# Patient Record
Sex: Female | Born: 1965 | Race: White | Hispanic: No | Marital: Married | Smoking: Current every day smoker
Health system: Southern US, Community
[De-identification: ages and names within clinical notes are randomized; demographics above are authoritative.]

## PROBLEM LIST (undated history)

## (undated) DIAGNOSIS — I10 Essential (primary) hypertension: Secondary | ICD-10-CM

## (undated) DIAGNOSIS — R3915 Urgency of urination: Secondary | ICD-10-CM

## (undated) DIAGNOSIS — N201 Calculus of ureter: Secondary | ICD-10-CM

## (undated) DIAGNOSIS — Z973 Presence of spectacles and contact lenses: Secondary | ICD-10-CM

## (undated) DIAGNOSIS — E039 Hypothyroidism, unspecified: Secondary | ICD-10-CM

## (undated) DIAGNOSIS — Z87442 Personal history of urinary calculi: Secondary | ICD-10-CM

## (undated) DIAGNOSIS — Z8489 Family history of other specified conditions: Secondary | ICD-10-CM

## (undated) DIAGNOSIS — R350 Frequency of micturition: Secondary | ICD-10-CM

## (undated) DIAGNOSIS — E119 Type 2 diabetes mellitus without complications: Secondary | ICD-10-CM

## (undated) DIAGNOSIS — M329 Systemic lupus erythematosus, unspecified: Secondary | ICD-10-CM

## (undated) DIAGNOSIS — IMO0002 Reserved for concepts with insufficient information to code with codable children: Secondary | ICD-10-CM

## (undated) HISTORY — PX: CHOLECYSTECTOMY OPEN: SUR202

---

## 2003-06-25 ENCOUNTER — Ambulatory Visit (HOSPITAL_COMMUNITY): Admission: RE | Admit: 2003-06-25 | Discharge: 2003-06-25 | Payer: Self-pay | Admitting: Pulmonary Disease

## 2004-10-22 ENCOUNTER — Ambulatory Visit (HOSPITAL_COMMUNITY): Admission: RE | Admit: 2004-10-22 | Discharge: 2004-10-22 | Payer: Self-pay | Admitting: Pulmonary Disease

## 2005-06-07 ENCOUNTER — Ambulatory Visit (HOSPITAL_COMMUNITY): Admission: RE | Admit: 2005-06-07 | Discharge: 2005-06-07 | Payer: Self-pay | Admitting: Pulmonary Disease

## 2010-10-26 ENCOUNTER — Other Ambulatory Visit (HOSPITAL_COMMUNITY): Payer: Self-pay | Admitting: Pulmonary Disease

## 2010-10-26 ENCOUNTER — Ambulatory Visit (HOSPITAL_COMMUNITY)
Admission: RE | Admit: 2010-10-26 | Discharge: 2010-10-26 | Disposition: A | Discharge status: Home / Self Care | Payer: BC Managed Care – PPO | Source: Ambulatory Visit | Attending: Pulmonary Disease | Admitting: Pulmonary Disease

## 2010-10-26 ENCOUNTER — Encounter (HOSPITAL_COMMUNITY): Payer: Self-pay

## 2010-10-26 DIAGNOSIS — Z139 Encounter for screening, unspecified: Secondary | ICD-10-CM

## 2010-10-26 DIAGNOSIS — M25562 Pain in left knee: Secondary | ICD-10-CM

## 2010-10-26 DIAGNOSIS — M25569 Pain in unspecified knee: Secondary | ICD-10-CM | POA: Insufficient documentation

## 2010-10-26 HISTORY — DX: Essential (primary) hypertension: I10

## 2010-10-30 ENCOUNTER — Ambulatory Visit (HOSPITAL_COMMUNITY)
Admission: RE | Admit: 2010-10-30 | Discharge: 2010-10-30 | Disposition: A | Discharge status: Home / Self Care | Payer: BC Managed Care – PPO | Source: Ambulatory Visit | Attending: Pulmonary Disease | Admitting: Pulmonary Disease

## 2010-10-30 DIAGNOSIS — Z1231 Encounter for screening mammogram for malignant neoplasm of breast: Secondary | ICD-10-CM | POA: Insufficient documentation

## 2010-10-30 DIAGNOSIS — Z139 Encounter for screening, unspecified: Secondary | ICD-10-CM

## 2010-12-26 ENCOUNTER — Emergency Department (HOSPITAL_COMMUNITY)
Admission: EM | Admit: 2010-12-26 | Discharge: 2010-12-26 | Disposition: A | Discharge status: Home / Self Care | Payer: BC Managed Care – PPO | Attending: Emergency Medicine | Admitting: Emergency Medicine

## 2010-12-26 DIAGNOSIS — Z79899 Other long term (current) drug therapy: Secondary | ICD-10-CM | POA: Insufficient documentation

## 2010-12-26 DIAGNOSIS — I10 Essential (primary) hypertension: Secondary | ICD-10-CM | POA: Insufficient documentation

## 2010-12-26 DIAGNOSIS — R5381 Other malaise: Secondary | ICD-10-CM | POA: Insufficient documentation

## 2010-12-26 DIAGNOSIS — E119 Type 2 diabetes mellitus without complications: Secondary | ICD-10-CM | POA: Insufficient documentation

## 2010-12-26 DIAGNOSIS — N764 Abscess of vulva: Secondary | ICD-10-CM | POA: Insufficient documentation

## 2012-08-24 ENCOUNTER — Other Ambulatory Visit (HOSPITAL_COMMUNITY): Payer: Self-pay | Admitting: Pulmonary Disease

## 2012-08-24 DIAGNOSIS — Z139 Encounter for screening, unspecified: Secondary | ICD-10-CM

## 2012-08-25 ENCOUNTER — Ambulatory Visit (HOSPITAL_COMMUNITY)
Admission: RE | Admit: 2012-08-25 | Discharge: 2012-08-25 | Disposition: A | Discharge status: Home / Self Care | Payer: BC Managed Care – PPO | Source: Ambulatory Visit | Attending: Pulmonary Disease | Admitting: Pulmonary Disease

## 2012-08-25 DIAGNOSIS — Z139 Encounter for screening, unspecified: Secondary | ICD-10-CM

## 2012-08-25 DIAGNOSIS — Z1231 Encounter for screening mammogram for malignant neoplasm of breast: Secondary | ICD-10-CM | POA: Insufficient documentation

## 2013-12-31 ENCOUNTER — Other Ambulatory Visit (HOSPITAL_COMMUNITY): Payer: Self-pay | Admitting: Pulmonary Disease

## 2013-12-31 DIAGNOSIS — Z1231 Encounter for screening mammogram for malignant neoplasm of breast: Secondary | ICD-10-CM

## 2014-01-01 ENCOUNTER — Ambulatory Visit (HOSPITAL_COMMUNITY)
Admission: RE | Admit: 2014-01-01 | Discharge: 2014-01-01 | Disposition: A | Discharge status: Home / Self Care | Payer: BC Managed Care – PPO | Source: Ambulatory Visit | Attending: Pulmonary Disease | Admitting: Pulmonary Disease

## 2014-01-01 DIAGNOSIS — Z1231 Encounter for screening mammogram for malignant neoplasm of breast: Secondary | ICD-10-CM | POA: Insufficient documentation

## 2014-11-22 ENCOUNTER — Other Ambulatory Visit (HOSPITAL_COMMUNITY): Payer: Self-pay | Admitting: Pulmonary Disease

## 2014-11-22 DIAGNOSIS — Z1231 Encounter for screening mammogram for malignant neoplasm of breast: Secondary | ICD-10-CM

## 2015-01-13 ENCOUNTER — Ambulatory Visit (HOSPITAL_COMMUNITY): Payer: BC Managed Care – PPO

## 2015-01-27 ENCOUNTER — Ambulatory Visit (HOSPITAL_COMMUNITY)
Admission: RE | Admit: 2015-01-27 | Discharge: 2015-01-27 | Disposition: A | Discharge status: Home / Self Care | Payer: BC Managed Care – PPO | Source: Ambulatory Visit | Attending: Pulmonary Disease | Admitting: Pulmonary Disease

## 2015-01-27 DIAGNOSIS — Z1231 Encounter for screening mammogram for malignant neoplasm of breast: Secondary | ICD-10-CM

## 2015-01-29 ENCOUNTER — Ambulatory Visit (HOSPITAL_COMMUNITY): Payer: BC Managed Care – PPO

## 2015-03-17 ENCOUNTER — Other Ambulatory Visit (HOSPITAL_COMMUNITY): Payer: Self-pay | Admitting: Pulmonary Disease

## 2015-03-17 ENCOUNTER — Ambulatory Visit (HOSPITAL_COMMUNITY)
Admission: RE | Admit: 2015-03-17 | Discharge: 2015-03-17 | Disposition: A | Discharge status: Home / Self Care | Payer: BC Managed Care – PPO | Source: Ambulatory Visit | Attending: Pulmonary Disease | Admitting: Pulmonary Disease

## 2015-03-17 DIAGNOSIS — R3919 Other difficulties with micturition: Secondary | ICD-10-CM | POA: Diagnosis not present

## 2015-03-17 DIAGNOSIS — R1030 Lower abdominal pain, unspecified: Secondary | ICD-10-CM | POA: Insufficient documentation

## 2015-03-17 DIAGNOSIS — N200 Calculus of kidney: Secondary | ICD-10-CM

## 2015-03-17 DIAGNOSIS — M545 Low back pain: Secondary | ICD-10-CM | POA: Diagnosis not present

## 2015-03-17 DIAGNOSIS — K7460 Unspecified cirrhosis of liver: Secondary | ICD-10-CM | POA: Insufficient documentation

## 2015-03-17 DIAGNOSIS — R161 Splenomegaly, not elsewhere classified: Secondary | ICD-10-CM | POA: Diagnosis not present

## 2015-03-17 DIAGNOSIS — K760 Fatty (change of) liver, not elsewhere classified: Secondary | ICD-10-CM | POA: Insufficient documentation

## 2015-03-17 DIAGNOSIS — R319 Hematuria, unspecified: Secondary | ICD-10-CM | POA: Insufficient documentation

## 2015-03-17 DIAGNOSIS — N202 Calculus of kidney with calculus of ureter: Secondary | ICD-10-CM | POA: Diagnosis not present

## 2015-03-17 DIAGNOSIS — N132 Hydronephrosis with renal and ureteral calculous obstruction: Secondary | ICD-10-CM | POA: Insufficient documentation

## 2015-03-21 ENCOUNTER — Other Ambulatory Visit: Payer: Self-pay | Admitting: Urology

## 2015-03-24 ENCOUNTER — Encounter: Payer: Self-pay | Admitting: Gastroenterology

## 2015-04-02 ENCOUNTER — Encounter (HOSPITAL_BASED_OUTPATIENT_CLINIC_OR_DEPARTMENT_OTHER): Payer: Self-pay | Admitting: *Deleted

## 2015-04-03 ENCOUNTER — Encounter (HOSPITAL_BASED_OUTPATIENT_CLINIC_OR_DEPARTMENT_OTHER): Payer: Self-pay | Admitting: *Deleted

## 2015-04-03 NOTE — Progress Notes (Signed)
NPO AFTER MN WITH EXCEPTION CLEAR LIQUIDS UNTIL 0800 (NO CREAM/ MILK PRODUCTS).  ARRIVE AT 1230.  NEEDS ISTAT 8 AND EKG.  WILL TAKE METOPROLOL AND SYNTHROID AM DOS W/ SIPS  OF WATER AND IF NEEDED TAKE HYDRCODONE.

## 2015-04-04 ENCOUNTER — Ambulatory Visit (HOSPITAL_BASED_OUTPATIENT_CLINIC_OR_DEPARTMENT_OTHER)
Admission: RE | Admit: 2015-04-04 | Discharge: 2015-04-04 | Disposition: A | Discharge status: Home / Self Care | Payer: BC Managed Care – PPO | Source: Ambulatory Visit | Attending: Urology | Admitting: Urology

## 2015-04-04 ENCOUNTER — Ambulatory Visit (HOSPITAL_BASED_OUTPATIENT_CLINIC_OR_DEPARTMENT_OTHER): Payer: BC Managed Care – PPO | Admitting: Anesthesiology

## 2015-04-04 ENCOUNTER — Encounter (HOSPITAL_BASED_OUTPATIENT_CLINIC_OR_DEPARTMENT_OTHER)
Admission: RE | Disposition: A | Discharge status: Home / Self Care | Payer: Self-pay | Source: Ambulatory Visit | Attending: Urology

## 2015-04-04 ENCOUNTER — Encounter (HOSPITAL_BASED_OUTPATIENT_CLINIC_OR_DEPARTMENT_OTHER): Payer: Self-pay

## 2015-04-04 DIAGNOSIS — Z87442 Personal history of urinary calculi: Secondary | ICD-10-CM | POA: Diagnosis not present

## 2015-04-04 DIAGNOSIS — F17210 Nicotine dependence, cigarettes, uncomplicated: Secondary | ICD-10-CM | POA: Diagnosis not present

## 2015-04-04 DIAGNOSIS — E119 Type 2 diabetes mellitus without complications: Secondary | ICD-10-CM | POA: Diagnosis not present

## 2015-04-04 DIAGNOSIS — M329 Systemic lupus erythematosus, unspecified: Secondary | ICD-10-CM | POA: Diagnosis not present

## 2015-04-04 DIAGNOSIS — I10 Essential (primary) hypertension: Secondary | ICD-10-CM | POA: Insufficient documentation

## 2015-04-04 DIAGNOSIS — Z6841 Body Mass Index (BMI) 40.0 and over, adult: Secondary | ICD-10-CM | POA: Diagnosis not present

## 2015-04-04 DIAGNOSIS — N132 Hydronephrosis with renal and ureteral calculous obstruction: Secondary | ICD-10-CM | POA: Diagnosis present

## 2015-04-04 DIAGNOSIS — E039 Hypothyroidism, unspecified: Secondary | ICD-10-CM | POA: Diagnosis not present

## 2015-04-04 HISTORY — DX: Reserved for concepts with insufficient information to code with codable children: IMO0002

## 2015-04-04 HISTORY — DX: Calculus of ureter: N20.1

## 2015-04-04 HISTORY — DX: Urgency of urination: R39.15

## 2015-04-04 HISTORY — DX: Family history of other specified conditions: Z84.89

## 2015-04-04 HISTORY — DX: Frequency of micturition: R35.0

## 2015-04-04 HISTORY — DX: Presence of spectacles and contact lenses: Z97.3

## 2015-04-04 HISTORY — DX: Personal history of urinary calculi: Z87.442

## 2015-04-04 HISTORY — DX: Type 2 diabetes mellitus without complications: E11.9

## 2015-04-04 HISTORY — PX: HOLMIUM LASER APPLICATION: SHX5852

## 2015-04-04 HISTORY — PX: CYSTOSCOPY WITH RETROGRADE PYELOGRAM, URETEROSCOPY AND STENT PLACEMENT: SHX5789

## 2015-04-04 HISTORY — DX: Systemic lupus erythematosus, unspecified: M32.9

## 2015-04-04 HISTORY — DX: Hypothyroidism, unspecified: E03.9

## 2015-04-04 LAB — POCT I-STAT, CHEM 8
BUN: 9 mg/dL (ref 6–20)
CREATININE: 0.7 mg/dL (ref 0.44–1.00)
Calcium, Ion: 1.14 mmol/L (ref 1.12–1.23)
Chloride: 105 mmol/L (ref 101–111)
GLUCOSE: 93 mg/dL (ref 65–99)
HEMATOCRIT: 47 % — AB (ref 36.0–46.0)
HEMOGLOBIN: 16 g/dL — AB (ref 12.0–15.0)
Potassium: 4.2 mmol/L (ref 3.5–5.1)
Sodium: 139 mmol/L (ref 135–145)
TCO2: 19 mmol/L (ref 0–100)

## 2015-04-04 LAB — GLUCOSE, CAPILLARY
GLUCOSE-CAPILLARY: 61 mg/dL — AB (ref 65–99)
GLUCOSE-CAPILLARY: 163 mg/dL — AB (ref 65–99)

## 2015-04-04 SURGERY — CYSTOURETEROSCOPY, WITH RETROGRADE PYELOGRAM AND STENT INSERTION
Anesthesia: General | Site: Ureter | Laterality: Right

## 2015-04-04 MED ORDER — OXYCODONE-ACETAMINOPHEN 5-325 MG PO TABS: 1.0000 | ORAL_TABLET | ORAL | Status: DC | PRN

## 2015-04-04 MED ORDER — SULFAMETHOXAZOLE-TRIMETHOPRIM 800-160 MG PO TABS: 1.0000 | ORAL_TABLET | Freq: Two times a day (BID) | ORAL | Status: DC

## 2015-04-04 MED ORDER — MEPERIDINE HCL 25 MG/ML IJ SOLN
6.2500 mg | INTRAMUSCULAR | Status: DC | PRN
  Filled 2015-04-04: qty 1

## 2015-04-04 MED ORDER — FENTANYL CITRATE (PF) 100 MCG/2ML IJ SOLN
INTRAMUSCULAR | Status: DC | PRN
  Administered 2015-04-04 (×2): 50 ug via INTRAVENOUS

## 2015-04-04 MED ORDER — SODIUM CHLORIDE 0.9 % IR SOLN
Status: DC | PRN
  Administered 2015-04-04: 3000 mL
  Administered 2015-04-04: 1000 mL

## 2015-04-04 MED ORDER — MIDAZOLAM HCL 5 MG/5ML IJ SOLN
INTRAMUSCULAR | Status: DC | PRN
  Administered 2015-04-04: 2 mg via INTRAVENOUS

## 2015-04-04 MED ORDER — OXYCODONE-ACETAMINOPHEN 5-325 MG PO TABS
ORAL_TABLET | ORAL | Status: AC
  Filled 2015-04-04: qty 1

## 2015-04-04 MED ORDER — GENTAMICIN IN SALINE 1.6-0.9 MG/ML-% IV SOLN
80.0000 mg | INTRAVENOUS | Status: DC
  Filled 2015-04-04: qty 50

## 2015-04-04 MED ORDER — FENTANYL CITRATE (PF) 100 MCG/2ML IJ SOLN
25.0000 ug | INTRAMUSCULAR | Status: DC | PRN
  Filled 2015-04-04: qty 1

## 2015-04-04 MED ORDER — MIDAZOLAM HCL 2 MG/2ML IJ SOLN
INTRAMUSCULAR | Status: AC
  Filled 2015-04-04: qty 2

## 2015-04-04 MED ORDER — SENNOSIDES-DOCUSATE SODIUM 8.6-50 MG PO TABS: 1.0000 | ORAL_TABLET | Freq: Two times a day (BID) | ORAL | Status: DC

## 2015-04-04 MED ORDER — OXYCODONE-ACETAMINOPHEN 5-325 MG PO TABS
1.0000 | ORAL_TABLET | ORAL | Status: AC | PRN
  Administered 2015-04-04: 1 via ORAL
  Filled 2015-04-04: qty 2

## 2015-04-04 MED ORDER — FENTANYL CITRATE (PF) 100 MCG/2ML IJ SOLN
INTRAMUSCULAR | Status: AC
  Filled 2015-04-04: qty 4

## 2015-04-04 MED ORDER — KETOROLAC TROMETHAMINE 30 MG/ML IJ SOLN
INTRAMUSCULAR | Status: DC | PRN
  Administered 2015-04-04: 30 mg via INTRAVENOUS

## 2015-04-04 MED ORDER — PROMETHAZINE HCL 25 MG/ML IJ SOLN
6.2500 mg | INTRAMUSCULAR | Status: DC | PRN
Start: 2015-04-04 — End: 2015-04-04
  Filled 2015-04-04: qty 1

## 2015-04-04 MED ORDER — IOHEXOL 350 MG/ML SOLN
INTRAVENOUS | Status: DC | PRN
  Administered 2015-04-04: 12 mL

## 2015-04-04 MED ORDER — GENTAMICIN SULFATE 40 MG/ML IJ SOLN
5.0000 mg/kg | INTRAVENOUS | Status: AC
  Administered 2015-04-04: 380 mg via INTRAVENOUS
  Filled 2015-04-04: qty 9.5

## 2015-04-04 MED ORDER — LACTATED RINGERS IV SOLN
INTRAVENOUS | Status: DC
  Administered 2015-04-04: 13:00:00 via INTRAVENOUS
  Filled 2015-04-04: qty 1000

## 2015-04-04 MED ORDER — LIDOCAINE HCL (CARDIAC) 20 MG/ML IV SOLN
INTRAVENOUS | Status: DC | PRN
  Administered 2015-04-04: 100 mg via INTRAVENOUS

## 2015-04-04 MED ORDER — PROPOFOL 10 MG/ML IV BOLUS
INTRAVENOUS | Status: DC | PRN
  Administered 2015-04-04: 200 mg via INTRAVENOUS

## 2015-04-04 MED ORDER — DEXAMETHASONE SODIUM PHOSPHATE 4 MG/ML IJ SOLN
INTRAMUSCULAR | Status: DC | PRN
  Administered 2015-04-04: 10 mg via INTRAVENOUS

## 2015-04-04 MED ORDER — LACTATED RINGERS IV SOLN
INTRAVENOUS | Status: DC
  Filled 2015-04-04: qty 1000

## 2015-04-04 MED ORDER — ONDANSETRON HCL 4 MG/2ML IJ SOLN
INTRAMUSCULAR | Status: DC | PRN
  Administered 2015-04-04: 4 mg via INTRAVENOUS

## 2015-04-04 SURGICAL SUPPLY — 34 items
BAG DRAIN URO-CYSTO SKYTR STRL (DRAIN) ×3 IMPLANT
BASKET LASER NITINOL 1.9FR (BASKET) ×3 IMPLANT
BASKET STNLS GEMINI 4WIRE 3FR (BASKET) IMPLANT
BASKET STONE 1.7 NGAGE (UROLOGICAL SUPPLIES) IMPLANT
BASKET ZERO TIP NITINOL 2.4FR (BASKET) IMPLANT
CANISTER SUCT LVC 12 LTR MEDI- (MISCELLANEOUS) ×3 IMPLANT
CATH INTERMIT  6FR 70CM (CATHETERS) ×3 IMPLANT
CATH URET 5FR 28IN CONE TIP (BALLOONS)
CATH URET 5FR 70CM CONE TIP (BALLOONS) IMPLANT
CLOTH BEACON ORANGE TIMEOUT ST (SAFETY) ×3 IMPLANT
ELECT REM PT RETURN 9FT ADLT (ELECTROSURGICAL)
ELECTRODE REM PT RTRN 9FT ADLT (ELECTROSURGICAL) IMPLANT
FIBER LASER FLEXIVA 365 (UROLOGICAL SUPPLIES) IMPLANT
FIBER LASER TRAC TIP (UROLOGICAL SUPPLIES) ×3 IMPLANT
GLOVE BIO SURGEON STRL SZ7.5 (GLOVE) ×3 IMPLANT
GLOVE SURG SS PI 7.5 STRL IVOR (GLOVE) ×9 IMPLANT
GOWN STRL REUS W/ TWL LRG LVL3 (GOWN DISPOSABLE) ×1 IMPLANT
GOWN STRL REUS W/ TWL XL LVL3 (GOWN DISPOSABLE) ×1 IMPLANT
GOWN STRL REUS W/TWL LRG LVL3 (GOWN DISPOSABLE) ×2
GOWN STRL REUS W/TWL XL LVL3 (GOWN DISPOSABLE) ×2
GUIDEWIRE 0.038 PTFE COATED (WIRE) IMPLANT
GUIDEWIRE ANG ZIPWIRE 038X150 (WIRE) ×3 IMPLANT
GUIDEWIRE STR DUAL SENSOR (WIRE) ×3 IMPLANT
IV NS IRRIG 3000ML ARTHROMATIC (IV SOLUTION) ×6 IMPLANT
KIT BALLIN UROMAX 15FX10 (LABEL) IMPLANT
KIT BALLN UROMAX 15FX4 (MISCELLANEOUS) IMPLANT
KIT BALLN UROMAX 26 75X4 (MISCELLANEOUS)
MANIFOLD NEPTUNE II (INSTRUMENTS) ×3 IMPLANT
PACK CYSTO (CUSTOM PROCEDURE TRAY) ×3 IMPLANT
SET HIGH PRES BAL DIL (LABEL)
STENT POLARIS 5FRX22 (STENTS) ×3 IMPLANT
SYRINGE 10CC LL (SYRINGE) ×3 IMPLANT
SYRINGE IRR TOOMEY STRL 70CC (SYRINGE) IMPLANT
TUBE FEEDING 8FR 16IN STR KANG (MISCELLANEOUS) IMPLANT

## 2015-04-04 NOTE — Anesthesia Procedure Notes (Signed)
Procedure Name: LMA Insertion Date/Time: 04/04/2015 2:33 PM Performed by: Wanita Chamberlain Pre-anesthesia Checklist: Patient identified, Timeout performed, Emergency Drugs available, Suction available and Patient being monitored Patient Re-evaluated:Patient Re-evaluated prior to inductionOxygen Delivery Method: Circle system utilized Preoxygenation: Pre-oxygenation with 100% oxygen Intubation Type: IV induction Ventilation: Mask ventilation without difficulty LMA: LMA inserted LMA Size: 4.0 Number of attempts: 1 Placement Confirmation: breath sounds checked- equal and bilateral and positive ETCO2 Tube secured with: Tape Dental Injury: Teeth and Oropharynx as per pre-operative assessment

## 2015-04-04 NOTE — Brief Op Note (Signed)
04/04/2015  3:01 PM  PATIENT:  Chelsea Estrada  49 y.o. female  PRE-OPERATIVE DIAGNOSIS:  RIGHT URETERAL STONE  POST-OPERATIVE DIAGNOSIS:  RIGHT URETERAL STONE  PROCEDURE:  Procedure(s): CYSTOSCOPY WITH RETROGRADE PYELOGRAM, URETEROSCOPY AND STENT PLACEMENT (Right) HOLMIUM LASER APPLICATION (Right)  SURGEON:  Surgeon(s) and Role:    * Alexis Frock, MD - Primary  PHYSICIAN ASSISTANT:   ASSISTANTS: none   ANESTHESIA:   general  EBL:     BLOOD ADMINISTERED:none  DRAINS: none   LOCAL MEDICATIONS USED:  NONE  SPECIMEN:  Source of Specimen:  Rt ureteral stone fragments  DISPOSITION OF SPECIMEN:  [facility] for compositional analysis  COUNTS:  YES  TOURNIQUET:  * No tourniquets in log *  DICTATION: .Other Dictation: Dictation Number [id number]  PLAN OF CARE: Discharge to home after PACU  PATIENT DISPOSITION:  PACU - hemodynamically stable.   Delay start of Pharmacological VTE agent (>24hrs) due to surgical blood loss or risk of bleeding: yes

## 2015-04-04 NOTE — Anesthesia Preprocedure Evaluation (Addendum)
Anesthesia Evaluation  Patient identified by MRN, date of birth, ID band Patient awake    Reviewed: Allergy & Precautions, NPO status , Patient's Chart, lab work & pertinent test results  Airway Mallampati: II  TM Distance: >3 FB Neck ROM: Full    Dental no notable dental hx. (+) Teeth Intact, Dental Advisory Given   Pulmonary Current Smoker,  breath sounds clear to auscultation  Pulmonary exam normal       Cardiovascular hypertension, Pt. on medications Normal cardiovascular examRhythm:Regular Rate:Normal     Neuro/Psych negative neurological ROS  negative psych ROS   GI/Hepatic negative GI ROS, Neg liver ROS,   Endo/Other  diabetes, Type 2Hypothyroidism Morbid obesity  Renal/GU negative Renal ROS  negative genitourinary   Musculoskeletal Lupus   Abdominal   Peds negative pediatric ROS (+)  Hematology negative hematology ROS (+)   Anesthesia Other Findings   Reproductive/Obstetrics negative OB ROS                          Anesthesia Physical Anesthesia Plan  ASA: III  Anesthesia Plan: General   Post-op Pain Management:    Induction: Intravenous  Airway Management Planned: LMA  Additional Equipment:   Intra-op Plan:   Post-operative Plan: Extubation in OR  Informed Consent: I have reviewed the patients History and Physical, chart, labs and discussed the procedure including the risks, benefits and alternatives for the proposed anesthesia with the patient or authorized representative who has indicated his/her understanding and acceptance.   Dental advisory given  Plan Discussed with: CRNA  Anesthesia Plan Comments:         Anesthesia Quick Evaluation

## 2015-04-04 NOTE — Discharge Instructions (Signed)
1 - You may have urinary urgency (bladder spasms) and bloody urine on / off with stent in place. This is normal.  2 - Call MD or go to ER for fever >102, severe pain / nausea / vomiting not relieved by medications, or acute change in medical status  3 - Remove tethered stent on Monday morning at home by pulling on string, then blue-white plastic tubing, and discarding.     [facility] [phone] Post Ureteroscopy With or Without Stent Instructions  Definitions:  Ureter: The duct that transports urine from the kidney to the bladder. Stent:   A plastic hollow tube that is placed into the ureter, from the kidney to the bladder to prevent the ureter from swelling shut.  GENERAL INSTRUCTIONS:  Despite the fact that no skin incisions were used, the area around the ureter and bladder is raw and irritated. The stent is a foreign body which will further irritate the bladder wall. This irritation is manifested by increased frequency of urination, both day and night, and by an increase in the urge to urinate. In some, the urge to urinate is present almost always. Sometimes the urge is strong enough that you may not be able to stop yourself from urinating. The only real cure is to remove the stent and then give time for the bladder wall to heal which can't be done until the danger of the ureter swelling shut has passed, which varies.  You may see some blood in your urine while the stent is in place and a few days afterwards. Do not be alarmed, even if the urine was clear for a while. Get off your feet and drink lots of fluids until clearing occurs. If you start to pass clots or don't improve, call us.  DIET: You may return to your normal diet immediately. Because of the raw surface of your bladder, alcohol, spicy foods, acid type foods and drinks with caffeine may cause irritation or frequency and should be used in moderation. To keep your urine flowing freely and to avoid  constipation, drink plenty of fluids during the day ( 8-10 glasses ). Tip: Avoid cranberry juice because it is very acidic.  ACTIVITY: Your physical activity doesn't need to be restricted. However, if you are very active, you may see some blood in your urine. We suggest that you reduce your activity under these circumstances until the bleeding has stopped.  BOWELS: It is important to keep your bowels regular during the postoperative period. Straining with bowel movements can cause bleeding. A bowel movement every other day is reasonable. Use a mild laxative if needed, such as Milk of Magnesia 2-3 tablespoons, or 2 Dulcolax tablets. Call if you continue to have problems. If you have been taking narcotics for pain, before, during or after your surgery, you may be constipated. Take a laxative if necessary.   MEDICATION: You should resume your pre-surgery medications unless told not to. In addition you will often be given an antibiotic to prevent infection. These should be taken as prescribed until the bottles are finished unless you are having an unusual reaction to one of the drugs.  PROBLEMS YOU SHOULD REPORT TO [place]:  Fevers over 100.5 Fahrenheit.  Heavy bleeding, or clots ( See above notes about blood in urine ).  Inability to urinate.  Drug reactions ( hives, rash, nausea, vomiting, diarrhea ).  Severe burning or pain with urination that is not improving.  FOLLOW-UP: You will need a follow-up appointment to monitor your progress.  Call for this appointment at the number listed above. Usually the first appointment will be about three to fourteen days after your surgery.      Post Anesthesia Home Care Instructions  Activity: Get plenty of rest for the remainder of the day. A responsible adult should stay with you for 24 hours following the procedure.  For the next 24 hours, DO NOT: -Drive a car -Paediatric nurse -Drink alcoholic beverages -Take any medication unless instructed  by your physician -Make any legal decisions or sign important papers.  Meals: Start with liquid foods such as gelatin or soup. Progress to regular foods as tolerated. Avoid greasy, spicy, heavy foods. If nausea and/or vomiting occur, drink only clear liquids until the nausea and/or vomiting subsides. Call your physician if vomiting continues.  Special Instructions/Symptoms: Your throat may feel dry or sore from the anesthesia or the breathing tube placed in your throat during surgery. If this causes discomfort, gargle with warm salt water. The discomfort should disappear within 24 hours.  If you had a scopolamine patch placed behind your ear for the management of post- operative nausea and/or vomiting:  1. The medication in the patch is effective for 72 hours, after which it should be removed.  Wrap patch in a tissue and discard in the trash. Wash hands thoroughly with soap and water. 2. You may remove the patch earlier than 72 hours if you experience unpleasant side effects which may include dry mouth, dizziness or visual disturbances. 3. Avoid touching the patch. Wash your hands with soap and water after contact with the patch.

## 2015-04-04 NOTE — Transfer of Care (Signed)
Immediate Anesthesia Transfer of Care Note  Patient: Chelsea Estrada  Procedure(s) Performed: Procedure(s): CYSTOSCOPY WITH RETROGRADE PYELOGRAM, URETEROSCOPY AND STENT PLACEMENT (Right) HOLMIUM LASER APPLICATION (Right)  Patient Location: PACU  Anesthesia Type:General  Level of Consciousness: awake, alert , oriented and patient cooperative  Airway & Oxygen Therapy: Patient Spontanous Breathing and Patient connected to nasal cannula oxygen  Post-op Assessment: Report given to RN and Post -op Vital signs reviewed and stable  Post vital signs: Reviewed and stable  Last Vitals:  Filed Vitals:   04/04/15 1239  BP: 113/76  Pulse: 95  Temp: 37.1 C  Resp: 12    Complications: No apparent anesthesia complications

## 2015-04-04 NOTE — H&P (Addendum)
Chelsea Estrada is an 49 y.o. female.    Chief Complaint: Pre-op Right Ureteroscopic Stone Manipulation  HPI:   1 - Recurrent Nephrolithiasis -  Pre 2016 - MET x 4 including 49m stone 03/2015 - 728mright UVJ stone with mod hydro. ( SSD 12cm, 700 HU, 15m32mfusiform and medial to right mid femoral head on CT scout images. Left 3mm42mwer pole non-obstructing as well.  PMH sig for morbid obesity, open chole, DM2.  NO CV disease / blood thinners.  Her PCP is Chelsea Estrada " [name] seen to proceed with right ureteroscopic stone manipulation. Most recent UCX negative. No interval passage.   Past Medical History  Diagnosis Date  . Hypertension   . Type 2 diabetes mellitus   . Right ureteral stone   . Frequency of urination   . Urgency of urination   . History of kidney stones   . Lupus     effects joints--  rheumologist-  dr beekAmil AmengreeLady Garyical  . Hypothyroidism   . Wears glasses   . Family history of adverse reaction to anesthesia     mother-- PONV    Past Surgical History  Procedure Laterality Date  . Cholecystectomy open  age 69's23'sHistory reviewed. No pertinent family history. Social History:  reports that she has been smoking Cigarettes.  She has been smoking about 1.00 pack per day for the past 0 years. She has never used smokeless tobacco. She reports that she does not drink alcohol or use illicit drugs.  Allergies:  Allergies  Allergen Reactions  . Penicillins Hives    No prescriptions prior to admission    No results found for this or any previous visit (from the past 48 h22r(s)). No results found.  Review of Systems  Constitutional: Negative.  Negative for fever and chills.  HENT: Negative.   Eyes: Negative.   Respiratory: Negative.   Cardiovascular: Negative.   Gastrointestinal: Negative.  Negative for nausea and vomiting.  Genitourinary: Positive for urgency and flank pain.  Musculoskeletal: Negative.   Skin:  Negative.   Neurological: Negative.   Endo/Heme/Allergies: Negative.   Psychiatric/Behavioral: Negative.     Height _0  (1.6 m), weight 113.399 kg (250 lb), last menstrual period 12/22/2014. Physical Exam  Constitutional: She appears well-developed.  HENT:  Head: Normocephalic.  Eyes: Pupils are equal, round, and reactive to light.  Neck: Normal range of motion.  Cardiovascular: Normal rate.   Respiratory: Effort normal.  GI: Soft.  Genitourinary:  Mild Rt CVAT  Musculoskeletal: Normal range of motion.  Neurological: She is alert.  Skin: Skin is warm.  Psychiatric: She has a normal mood and affect.     Assessment/Plan  1 - Nephrolithiasis -  We rediscussed ureteroscopic stone manipulation with basketing and laser-lithotripsy in detail.  We rediscussed risks including bleeding, infection, damage to kidney / ureter  bladder, rarely loss of kidney. We rediscussed anesthetic risks and rare but serious surgical complications including DVT, PE, MI, and mortality. We specifically readdressed that in 5-10% of cases a staged approach is required with stenting followed by re-attempt ureteroscopy if anatomy unfavorable.   The patient voiced understanding and wises to proceed today as planned.   Chelsea Estrada 04/04/2015, 6:28 AM

## 2015-04-04 NOTE — Anesthesia Postprocedure Evaluation (Signed)
  Anesthesia Post-op Note  Patient: Chelsea Estrada  Procedure(s) Performed: Procedure(s) (LRB): CYSTOSCOPY WITH RETROGRADE PYELOGRAM, URETEROSCOPY AND STENT PLACEMENT (Right) HOLMIUM LASER APPLICATION (Right)  Patient Location: PACU  Anesthesia Type: General  Level of Consciousness: awake and alert   Airway and Oxygen Therapy: Patient Spontanous Breathing  Post-op Pain: mild  Post-op Assessment: Post-op Vital signs reviewed, Patient's Cardiovascular Status Stable, Respiratory Function Stable, Patent Airway and No signs of Nausea or vomiting  Last Vitals:  Filed Vitals:   04/04/15 1545  BP: 118/59  Pulse: 86  Temp:   Resp: 20    Post-op Vital Signs: stable   Complications: No apparent anesthesia complications

## 2015-04-07 ENCOUNTER — Encounter (HOSPITAL_BASED_OUTPATIENT_CLINIC_OR_DEPARTMENT_OTHER): Payer: Self-pay | Admitting: Urology

## 2015-04-07 NOTE — Op Note (Signed)
Chelsea Estrada, Chelsea Estrada NO.:  [id number]  MEDICAL RECORD NO.:  [id number]  LOCATION:                               FACILITY:  [facility]  PHYSICIAN:  Alexis Frock, MD     DATE OF BIRTH:  04/18/1966  DATE OF PROCEDURE:  04/04/2015                              OPERATIVE REPORT  DIAGNOSIS:  Right distal ureteral stone, refractory, colic.  PROCEDURES: 1. Cystoscopy with right retrograde pyelogram and interpretation. 2. Right ureteroscopy with laser lithotripsy. 3. Insertion of right ureteral stent, 5 x 22, Polaris, with tether.  ESTIMATED BLOOD LOSS:  Nil.  COMPLICATIONS:  None.  SPECIMENS:  Right ureteral stone fragments for compositional analysis.  FINDINGS: 1. Impacted right distal ureteral stone with mild proximal     hydroureteronephrosis. 2. Successful placement of right ureteral stent proximal in the renal     pelvis and distal in the urinary bladder. 3. Complete resolution of all stone fragments within the distal four-     fifths of the ureter following laser lithotripsy and basket     extraction.  INDICATION:  Chelsea Estrada is a very pleasant 49 year old lady with history of obesity and diabetes, who was found on workup of flank pain to have a right distal ureteral stone approximately 7 mm in diameter.  She underwent initial medical expulsive therapy for this with alpha blockers and pain meds, and vigorous hydration; however, she has failed to pass her stone in the interval.  Options were discussed for further management including continued medical therapy versus shockwave lithotripsy versus ureteroscopy, and she wished to proceed with the latter.  Informed consent was obtained and placed in the medical record.  PROCEDURE IN DETAIL:  The patient being Chelsea Estrada, was verified. Procedure being right ureteroscopic stone manipulation was confirmed. Procedure was carried out.  Time-out was performed.  Intravenous antibiotics were administered.  General  LMA anesthesia was introduced. The patient was placed into a low lithotomy position and sterile field was created by prepping and draping the patient's vagina, introitus, and proximal thighs using iodine x3.  Next, cystourethroscopy was performed using a 23-French rigid cystoscope with 30-degree offset lens. Inspection of the urinary bladder revealed no diverticula, calcifications, or papillary lesions.  The right ureteral orifice was cannulated with 6-French end-hole catheter and right retrograde pyelogram was obtained.  Right retrograde pyelogram demonstrated a single right ureter with single-system right kidney.  There was mild hydroureteronephrosis to the level of the distal ureter with filling defect just proximal to the ureterovesical junction.  A 0.038 zip wire was advanced at the level of the upper pole and set aside as a safety wire.  Next, semi-rigid ureteroscopy was performed in the distal ureter, alongside, a separate Sensor working wire.  I suspected the distal ureteral stone was encountered, this appeared to be too large for simple basketing.  As such, holmium laser energy was applied to the stone using settings of 0.3 joules and 20 Hz fragmenting the stone into two dominant pieces, which then did appear amenable to basketing.  These were then grasped sequentially on their long axis with an Escape basket and brought out in their entirety and set aside for compositional analysis.  Semi-rigid ureteroscopy  was performed of the remainder of the distal four-fifths of the right ureter.  No additional calcifications or mucosal abnormalities were found as this was the only calcification noted on the patient's prior imaging and it was not felt that flexible ureteroscopy of the kidney was warranted.  As such, the ureteroscope was removed under continuous vision, no mucosal abnormalities were found.  There was some mild edema at the distal ureter consistent with prior stone  impaction. As such, a new 5 x 22 Polaris-type stent was placed over the remaining safety wire using fluoroscopic guidance.  Good proximal and distal deployment were noted.  The tether was left in place and fashioned to the pubis.  Bladder was emptied per cystoscope.  Procedure was then terminated.  The patient tolerated the procedure well.  There were no immediate periprocedural complications.  The patient was taken to the postanesthesia care unit in stable condition.          ______________________________ Alexis Frock, MD     TM/MEDQ  D:  04/04/2015  T:  04/05/2015  Job:  [id number]

## 2015-04-21 ENCOUNTER — Ambulatory Visit: Payer: BC Managed Care – PPO | Admitting: Nurse Practitioner

## 2015-04-28 ENCOUNTER — Other Ambulatory Visit: Payer: Self-pay | Admitting: Nurse Practitioner

## 2015-04-28 ENCOUNTER — Encounter: Payer: Self-pay | Admitting: Nurse Practitioner

## 2015-04-28 ENCOUNTER — Ambulatory Visit (INDEPENDENT_AMBULATORY_CARE_PROVIDER_SITE_OTHER): Payer: BC Managed Care – PPO | Admitting: Nurse Practitioner

## 2015-04-28 VITALS — BP 114/72 | HR 91 | Temp 97.6°F | Ht 63.0 in | Wt 242.8 lb

## 2015-04-28 DIAGNOSIS — K7460 Unspecified cirrhosis of liver: Secondary | ICD-10-CM | POA: Insufficient documentation

## 2015-04-28 DIAGNOSIS — R161 Splenomegaly, not elsewhere classified: Secondary | ICD-10-CM | POA: Diagnosis not present

## 2015-04-28 LAB — CBC WITH DIFFERENTIAL/PLATELET
Basophils Absolute: 0 10*3/uL (ref 0.0–0.2)
Basos: 0 %
EOS (ABSOLUTE): 0.1 10*3/uL (ref 0.0–0.4)
Eos: 2 %
Hematocrit: 40.5 % (ref 34.0–46.6)
Hemoglobin: 14.3 g/dL (ref 11.1–15.9)
LYMPHS ABS: 2 10*3/uL (ref 0.7–3.1)
Lymphs: 33 %
MCH: 34.2 pg — AB (ref 26.6–33.0)
MCHC: 35.3 g/dL (ref 31.5–35.7)
MCV: 97 fL (ref 79–97)
MONOS ABS: 0.8 10*3/uL (ref 0.1–0.9)
Monocytes: 13 %
Neutrophils Absolute: 3.2 10*3/uL (ref 1.4–7.0)
Neutrophils: 52 %
PLATELETS: 166 10*3/uL (ref 150–379)
RBC: 4.18 x10E6/uL (ref 3.77–5.28)
RDW: 13.7 % (ref 12.3–15.4)
WBC: 6.2 10*3/uL (ref 3.4–10.8)

## 2015-04-28 LAB — COMPREHENSIVE METABOLIC PANEL
ALT: 25 IU/L (ref 0–32)
AST: 40 IU/L (ref 0–40)
Albumin/Globulin Ratio: 1.4 (ref 1.1–2.5)
Albumin: 4.3 g/dL (ref 3.5–5.5)
Alkaline Phosphatase: 83 IU/L (ref 39–117)
BILIRUBIN TOTAL: 0.4 mg/dL (ref 0.0–1.2)
BUN/Creatinine Ratio: 18 (ref 9–23)
BUN: 11 mg/dL (ref 6–24)
CALCIUM: 10 mg/dL (ref 8.7–10.2)
CHLORIDE: 104 mmol/L (ref 97–108)
CO2: 21 mmol/L (ref 18–29)
Creatinine, Ser: 0.62 mg/dL (ref 0.57–1.00)
GFR calc non Af Amer: 107 mL/min/{1.73_m2} (ref >59)
GFR, EST AFRICAN AMERICAN: 123 mL/min/{1.73_m2} (ref >59)
GLUCOSE: 261 mg/dL — AB (ref 65–99)
Globulin, Total: 3.1 g/dL (ref 1.5–4.5)
Potassium: 4.4 mmol/L (ref 3.5–5.2)
Sodium: 139 mmol/L (ref 134–144)
Total Protein: 7.4 g/dL (ref 6.0–8.5)

## 2015-04-28 NOTE — Patient Instructions (Addendum)
1. Have your labs drawn when you're able to. 2. We will help you schedule your ultrasound. 3. Follow-up in 2 months. 4. As we discussed, its very important to get your cholesterol, diabetes, and blood pressure under good control. Lower some instructions to help.  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 Low fat/cholesterol diet.  Avoid sweets, sodas, fruit juices, sweetened beverages like tea, etc. Gradually increase exercise from 15 min daily up to 1 hr per day 5 days/week. Limit alcohol use.    Cirrhosis Cirrhosis is a condition of scarring of the liver which is caused when the liver has tried repairing itself following damage. This damage may come from a previous infection such as one of the forms of hepatitis (usually hepatitis C), or the damage may come from being injured by toxins. The main toxin that causes this damage is alcohol. The scarring of the liver from use of alcohol is irreversible. That means the liver cannot return to normal even though alcohol is not used any more. The main danger of hepatitis C infection is that it may cause long-lasting (chronic) liver disease, and this also may lead to cirrhosis. This complication is progressive and irreversible. CAUSES  Prior to available blood tests, hepatitis C could be contracted by blood transfusions. Since testing of blood has improved, this is now unlikely. This infection can also be contracted through intravenous drug use and the sharing of needles. It can also be contracted through sexual relationships. The injury caused by alcohol comes from too much use. It is not a few drinks that poison the liver, but years of misuse. Usually there will be some signs and symptoms early with scarring of the liver that suggest the development of better habits. Alcohol should never be used while using acetaminophen. A small dose of both taken  together may cause irreversible damage to the liver. HOME CARE INSTRUCTIONS  There is no specific treatment for cirrhosis. However, there are things you can do to avoid making the condition worse.  Rest as needed.  Eat a well-balanced diet. Your caregiver can help you with suggestions.  Vitamin supplements including vitamins A, K, D, and thiamine can help.  A low-salt diet, water restriction, or diuretic medicine may be needed to reduce fluid retention.  Avoid alcohol. This can be extremely toxic if combined with acetaminophen.  Avoid drugs which are toxic to the liver. Some of these include isoniazid, methyldopa, acetaminophen, anabolic steroids (muscle-building drugs), erythromycin, and oral contraceptives (birth control pills). Check with your caregiver to make sure medicines you are presently taking will not be harmful.  Periodic blood tests may be required. Follow your caregiver's advice regarding the timing of these.  Milk thistle is an herbal remedy which does protect the liver against toxins. However, it will not help once the liver has been scarred. SEEK MEDICAL CARE IF:  You have increasing fatigue or weakness.  You develop swelling of the hands, feet, legs, or face.  You vomit bright red blood, or a coffee ground appearing material.  You have blood in your stools, or the stools turn black and tarry.  You have a fever.  You develop loss of appetite, or have nausea and vomiting.  You develop jaundice.  You develop easy bruising or bleeding.  You have worsening of any of the problems you are concerned about. Document Released: 08/23/2005 Document Revised: 11/15/2011 Document Reviewed: 04/10/2008 [employer] Patient Information 2015  [employer]. This information is not intended to replace advice given to you by your health care provider. Make sure you discuss any questions you have with your health care provider.   Enlarged Spleen The spleen is an organ located in  the upper abdomen under your left ribs. It is a spongelike organ, about the size of an orange, which acts as a filter. The spleen is part of the lymph system and filters the blood. It removes old blood cells and abnormal blood cells. It is also part of the immune response and helps fight infections. An enlarged spleen (splenomegaly) is usually noticed when it is almost twice its normal size. CAUSES  There are many possible causes of an enlarged spleen. These causes include:  Infections (viral, bacterial, or parasitic).  Liver cirrhosis and other liver diseases.  Hemolytic anemia (types of anemia that lower your red blood cell count) and other blood diseases.  Hypersplenism (reduction in many types of blood cells by an enlarged spleen).  Blood cancers (leukemia, Hodgkin's disease).  Metabolic disorders (Gaucher's disease, Niemann-Pick disease).  Tumors and cysts.  Pressure or blood clots in the veins of the spleen.  Connective tissue disorders (lupus, rheumatoid arthritis with Felty's syndrome). SYMPTOMS  An enlarged spleen may not always cause symptoms. If symptoms do occur, they may include:  Pain in the upper left abdomen (pain may spread to the left shoulder or get worse when you take a breath).  Feeling full without eating or eating only a small amount.  Feeling tired.  Chronic infections.  Bleeding easily. DIAGNOSIS  Tests may include:  Physical examination of the left upper abdomen.  Blood tests to check red and white blood cells and other proteins and enzymes.  Imaging tests, such as abdominal ultrasonography, computerized X-ray scan (computed tomography, CT), and computerized magnetic scan (magnetic resonance imaging, MRI).  Taking a tissue sample (biopsy) of the liver to examine it.  Examining a bone marrow biopsy sample. TREATMENT  Treatment varies depending on the cause of the enlarged spleen. Treatment aims to manage the conditions that cause swelling of the  spleen and reduce the size of the spleen. Treatment may include:  Medications to eliminate infection or treat disease.  Radiation therapy.  Blood transfusions.  Vaccinations. If these treatments are not successful, or the cause cannot be determined, surgery to remove the spleen (splenectomy) may be recommended. HOME CARE INSTRUCTIONS   Take all medications as directed.  Take all antibiotics, even if you start to feel better. Discuss with your caregiver the use of a probiotic supplement to prevent stomach upset.  To avoid injury or a ruptured spleen:  Limit activities as directed.  Avoid contact sports.  Wear your seat belt in the car.  See your caregiver for vaccinations, follow up examinations and testing as directed.  Follow all of your caregiver's instructions on managing the conditions that cause your enlarged spleen. PREVENTION  It is not always possible to prevent an enlarged spleen. Reduce your chances of developing an enlarged spleen:  Practice good hygiene to prevent infection.  Get recommended vaccines to prevent infection. SEEK MEDICAL CARE IF:   You develop a fever (more than 100.55F [38.1 C]) or other signs of infection (chills, feeling unwell).  You experience injury or impact to the spleen area.  Your symptoms do not go away as you and your doctor expected.  You experience increased pain when you take in a breath.  Your symptoms worsen, or you develop new symptoms. MAKE SURE YOU:  Understand these instructions.  Will watch your condition.  Will get help right away if you are not doing well or get worse. Follow up with your caregiver to find out the results of your tests. Not all test results may be available during your visit. If your test results are not back during the visit, make an appointment with your caregiver to find out the results. Do not assume everything is normal if you have not heard from your caregiver or the medical facility. It is  important for you to follow up on all of your test results.  Document Released: 02/10/2010 Document Revised: 01/07/2014 Document Reviewed: 02/10/2010 [facility] Patient Information 2015 [place], [place]. This information is not intended to replace advice given to you by your health care provider. Make sure you discuss any questions you have with your health care provider.   Fatty Liver Fatty liver is the accumulation of fat in liver cells. It is also called hepatosteatosis or steatohepatitis. It is normal for your liver to contain some fat. If fat is more than 5 to 10% of your liver's weight, you have fatty liver.  There are often no symptoms (problems) for years while damage is still occurring. People often learn about their fatty liver when they have medical tests for other reasons. Fat can damage your liver for years or even decades without causing problems. When it becomes severe, it can cause fatigue, weight loss, weakness, and confusion. This makes you more likely to develop more serious liver problems. The liver is the largest organ in the body. It does a lot of work and often gives no warning signs when it is sick until late in a disease. The liver has many important jobs including:  Breaking down foods.  Storing vitamins, iron, and other minerals.  Making proteins.  Making bile for food digestion.  Breaking down many products including medications, alcohol and some poisons. CAUSES  There are a number of different conditions, medications, and poisons that can cause a fatty liver. Eating too many calories causes fat to build up in the liver. Not processing and breaking fats down normally may also cause this. Certain conditions, such as obesity, diabetes, and high triglycerides also cause this. Most fatty liver patients tend to be middle-aged and over weight.  Some causes of fatty liver are:  Alcohol over consumption.  Malnutrition.  Steroid use.  Valproic acid  toxicity.  Obesity.  Cushing's syndrome.  Poisons.  Tetracycline in high dosages.  Pregnancy.  Diabetes.  Hyperlipidemia.  Rapid weight loss. Some people develop fatty liver even having none of these conditions. SYMPTOMS  Fatty liver most often causes no problems. This is called asymptomatic.  It can be diagnosed with blood tests and also by a liver biopsy.  It is one of the most common causes of minor elevations of liver enzymes on routine blood tests.  Specialized Imaging of the liver using ultrasound, CT (computed tomography) scan, or MRI (magnetic resonance imaging) can suggest a fatty liver but a biopsy is needed to confirm it.  A biopsy involves taking a small sample of liver tissue. This is done by using a needle. It is then looked at under a microscope by a specialist. TREATMENT  It is important to treat the cause. Simple fatty liver without a medical reason may not need treatment.  Weight loss, fat restriction, and exercise in overweight patients produces inconsistent results but is worth trying.  Fatty liver due to alcohol toxicity may not improve even with stopping drinking.  Good control of diabetes may reduce fatty liver.  Lower your triglycerides through diet, medication or both.  Eat a balanced, healthy diet.  Increase your physical activity.  Get regular checkups from a liver specialist.  There are no medical or surgical treatments for a fatty liver or NASH, but improving your diet and increasing your exercise may help prevent or reverse some of the damage. PROGNOSIS  Fatty liver may cause no damage or it can lead to an inflammation of the liver. This is, called steatohepatitis. When it is linked to alcohol abuse, it is called alcoholic steatohepatitis. It often is not linked to alcohol. It is then called nonalcoholic steatohepatitis, or NASH. Over time the liver may become scarred and hardened. This condition is called cirrhosis. Cirrhosis is serious  and may lead to liver failure or cancer. NASH is one of the leading causes of cirrhosis. About 10-20% of Americans have fatty liver and a smaller 2-5% has NASH. Document Released: 10/08/2005 Document Revised: 11/15/2011 Document Reviewed: 01/02/2014 [facility] Patient Information 2015 [place], [place]. This information is not intended to replace advice given to you by your health care provider. Make sure you discuss any questions you have with your health care provider.

## 2015-04-28 NOTE — Progress Notes (Signed)
Primary Care Physician:  Alonza Bogus, MD Primary Gastroenterologist:  Dr. Oneida Alar  Chief Complaint  Patient presents with  . OTHER    Enlarged liver and spleen on recent CT scan    HPI:   49 year old female referred by primary care for incidental finding of cirrhosis on CT. The patient was having abdominal pain and was sent for CAT scan to rule out kidney stone. The CAT scan was completed on 03/17/2015 and notable findings are hepatomegaly, liver span 24 cm, nodular contour compatible with hepatic cirrhosis, low attenuation of the liver. Also noted splenomegaly with 14 cm splenic span. Her past medical history does include diabetes with last hemoglobin A1c of 7.2, high blood pressure, high cholesterol. Labs included with referral are CBC dated 10/18/2014 which shows normal hemoglobin and hematocrit and platelet count of 159. CMP notes normal liver function with AST/ALT of 34/20, total bili 0.6, alk phosphatase 72. At that time her hemoglobin A1c was 8.5 with an estimated average glucose of 197.  Today she denies abdominal pain, N/V (other then with recent kidney stone), yellowing of skin/eyes, darkened urine, LE edema, abdominal swelling. States her right side abdomen feels hard and has as long as she can remember. No tattoos, never done IV drugs. Not in [birth year] birth cohort. Denies hematochezia, melena. Has a bowel movement up to 3 times a day, typically has loose stools which is chronic for her especially with greasy foods, is s/p cholecystectomy. Denies chest pain, dyspnea, dizziness, lightheadedness, syncope, near syncope. Denies any other upper or lower GI symptoms.  Past Medical History  Diagnosis Date  . Hypertension   . Type 2 diabetes mellitus   . Right ureteral stone   . Frequency of urination   . Urgency of urination   . History of kidney stones   . Lupus     effects joints--  rheumologist-  dr Amil Amen w/ [facility]  . Hypothyroidism   . Wears glasses   .  Family history of adverse reaction to anesthesia     mother-- PONV    Past Surgical History  Procedure Laterality Date  . Cholecystectomy open  age 56's  . Cystoscopy with retrograde pyelogram, ureteroscopy and stent placement Right 04/04/2015    Procedure: CYSTOSCOPY WITH RETROGRADE PYELOGRAM, URETEROSCOPY AND STENT PLACEMENT;  Surgeon: Alexis Frock, MD;  Location: [facility];  Service: Urology;  Laterality: Right;  . Holmium laser application Right 03/03/3150    Procedure: HOLMIUM LASER APPLICATION;  Surgeon: Alexis Frock, MD;  Location: [facility];  Service: Urology;  Laterality: Right;    Current Outpatient Prescriptions  Medication Sig Dispense Refill  . aspirin EC 325 MG tablet Take 325 mg by mouth every evening.    . fenofibrate (TRICOR) 145 MG tablet Take 145 mg by mouth daily.    Marland Kitchen glyBURIDE-metformin (GLUCOVANCE) 5-500 MG per tablet Take 2 tablets by mouth 2 (two) times daily with a meal.    . hydroxychloroquine (PLAQUENIL) 200 MG tablet Take 200 mg by mouth 2 (two) times daily.    Marland Kitchen levothyroxine (LEVOXYL) 25 MCG tablet Take 25 mcg by mouth daily before breakfast.    . levothyroxine (SYNTHROID, LEVOTHROID) 25 MCG tablet Take 25 mcg by mouth daily before breakfast.    . Liraglutide (VICTOZA) 18 MG/3ML SOPN Inject 1.8 mg into the skin every morning.    Marland Kitchen lisinopril (PRINIVIL,ZESTRIL) 5 MG tablet Take 5 mg by mouth every morning.    . metoprolol succinate (TOPROL-XL) 50 MG 24 hr  tablet Take 50 mg by mouth every morning. Take with or immediately following a meal.    . niacin (NIASPAN) 500 MG CR tablet Take 500 mg by mouth at bedtime.    . niacin 500 MG tablet Take 500 mg by mouth every morning.     No current facility-administered medications for this visit.    Allergies as of 04/28/2015 - Review Complete 04/28/2015  Allergen Reaction Noted  . Penicillins Hives 10/26/2010    Family History  Problem Relation Age of Onset  . Colon cancer Neg  Hx   . Liver disease Mother   . Diabetes type II Mother   . Diabetes Mellitus II Father   . Hypertension Mother   . Hypertension Father   . Heart disease Father     Social History   Social History  . Marital Status: Married    Spouse Name: N/A  . Number of Children: N/A  . Years of Education: N/A   Occupational History  . Not on file.   Social History Main Topics  . Smoking status: Current Every Day Smoker -- 1.00 packs/day for 0 years    Types: Cigarettes  . Smokeless tobacco: Never Used     Comment: one pack daily  . Alcohol Use: No     Comment: Very rarely: typically once every 6+ months; typically 1 drink per occurrence.  . Drug Use: No  . Sexual Activity: Not on file   Other Topics Concern  . Not on file   Social History Narrative    Review of Systems: 10 point ROS negative except as per HPI.    Physical Exam: BP 114/72 mmHg  Pulse 91  Temp(Src) 97.6 F (36.4 C) (Oral)  Ht 5' 3" (1.6 m)  Wt 242 lb 12.8 oz (110.133 kg)  BMI 43.02 kg/m2 General:   Alert and oriented. Pleasant and cooperative. Well-nourished and well-developed.  Head:  Normocephalic and atraumatic. Eyes:  Without icterus, sclera clear and conjunctiva pink.  Ears:  Normal auditory acuity. Cardiovascular:  S1, S2 present without murmurs appreciated. Normal pulses noted. Extremities without clubbing or edema. Respiratory:  Clear to auscultation bilaterally. No wheezes, rales, or rhonchi. No distress.  Gastrointestinal:  +BS, soft, rounded, non-tender and non-distended. Mild hepatomegaly of approximately 1-2 fingerbreadths below the costal margin. No gross splenomagaly appreciated on exam.. No guarding or rebound. No masses appreciated.  Rectal:  Deferred  Skin:  Intact without significant lesions or rashes. Neurologic:  Alert and oriented x4;  grossly normal neurologically. Psych:  Alert and cooperative. Normal mood and affect. Heme/Lymph/Immune: No excessive bruising noted.    04/28/2015  9:30 AM

## 2015-04-29 LAB — HEPATITIS C ANTIBODY: Hep C Virus Ab: 0.2 s/co ratio (ref 0.0–0.9)

## 2015-04-29 LAB — AFP TUMOR MARKER: AFP-Tumor Marker: 3.3 ng/mL (ref 0.0–8.3)

## 2015-04-29 LAB — THYROID PEROXIDASE ANTIBODY: Thyroperoxidase Ab SerPl-aCnc: 13 IU/mL (ref 0–34)

## 2015-04-29 LAB — ANTI-SMOOTH MUSCLE ANTIBODY, IGG: Smooth Muscle Ab: 14 Units (ref 0–19)

## 2015-04-29 LAB — HEPATITIS B SURFACE ANTIGEN: HEP B S AG: NEGATIVE

## 2015-04-30 ENCOUNTER — Telehealth: Payer: Self-pay

## 2015-04-30 NOTE — Telephone Encounter (Signed)
Labs received fro [employer] and placed on [employer].

## 2015-05-02 ENCOUNTER — Encounter: Payer: Self-pay | Admitting: Nurse Practitioner

## 2015-05-02 ENCOUNTER — Ambulatory Visit (HOSPITAL_COMMUNITY)
Admission: RE | Admit: 2015-05-02 | Discharge: 2015-05-02 | Disposition: A | Discharge status: Home / Self Care | Payer: BC Managed Care – PPO | Source: Ambulatory Visit | Attending: Nurse Practitioner | Admitting: Nurse Practitioner

## 2015-05-02 DIAGNOSIS — I10 Essential (primary) hypertension: Secondary | ICD-10-CM | POA: Insufficient documentation

## 2015-05-02 DIAGNOSIS — E119 Type 2 diabetes mellitus without complications: Secondary | ICD-10-CM | POA: Diagnosis not present

## 2015-05-02 DIAGNOSIS — R161 Splenomegaly, not elsewhere classified: Secondary | ICD-10-CM

## 2015-05-02 DIAGNOSIS — Z9049 Acquired absence of other specified parts of digestive tract: Secondary | ICD-10-CM | POA: Insufficient documentation

## 2015-05-02 DIAGNOSIS — N200 Calculus of kidney: Secondary | ICD-10-CM | POA: Diagnosis not present

## 2015-05-02 DIAGNOSIS — F17200 Nicotine dependence, unspecified, uncomplicated: Secondary | ICD-10-CM | POA: Insufficient documentation

## 2015-05-02 DIAGNOSIS — K7460 Unspecified cirrhosis of liver: Secondary | ICD-10-CM

## 2015-05-02 DIAGNOSIS — R160 Hepatomegaly, not elsewhere classified: Secondary | ICD-10-CM | POA: Insufficient documentation

## 2015-05-02 NOTE — Assessment & Plan Note (Signed)
49 year old female with a history of poorly controlled diabetes, hypertension, obesity. She was noted to have incidental finding of likely cirrhosis on CT of the abdomen looking for kidney stone at that time. No overt risk factors for viral hepatitis. Her liver labs are currently normal. I feel this is most likely due to fatty infiltration and metabolic syndrome. Today we will recheck multiple labs to complete initial evaluation of cirrhosis including CBC, CMP, AFP. We'll also check for viral hepatitis with a hepatitis B and C. We'll also check for autoimmune disorders including anti-smooth muscle antibody and antimitochondrial antibody-kidney/liver. We'll also have right upper quadrant abdominal ultrasound elastography completed to evaluate for fibrosis. We'll have her return in 2 months for review of labs and results and to plan next steps. Depending on results she may need to be screened for esophageal varices at a future date.

## 2015-05-02 NOTE — Assessment & Plan Note (Signed)
Patient with splenomegaly noted on CAT scan. Her spleen was not overtly obvious on physical exam. Given her splenomegaly of advised her to avoid abdominal impact injuries. Platelet count is borderline normal at 159. We will recheck her CBC today along with other labs as noted above.

## 2015-05-06 NOTE — Progress Notes (Signed)
CC'ED TO PCP 

## 2015-05-22 ENCOUNTER — Telehealth: Payer: Self-pay | Admitting: General Practice

## 2015-05-22 NOTE — Telephone Encounter (Signed)
I called and spoke with Chelsea Estrada at [facility] and dhe told me that Chelsea Estrada made a payment for her abd u/s on yesterday of $577.35.  She has a self pay balance of $144.33 that was applied to her coinsurance.  Patient is made aware and voiced understanding.

## 2015-06-30 ENCOUNTER — Ambulatory Visit: Payer: BC Managed Care – PPO | Admitting: Nurse Practitioner

## 2015-07-28 ENCOUNTER — Other Ambulatory Visit: Payer: Self-pay

## 2015-07-28 ENCOUNTER — Encounter: Payer: Self-pay | Admitting: Nurse Practitioner

## 2015-07-28 ENCOUNTER — Ambulatory Visit (INDEPENDENT_AMBULATORY_CARE_PROVIDER_SITE_OTHER): Payer: BC Managed Care – PPO | Admitting: Nurse Practitioner

## 2015-07-28 VITALS — BP 135/84 | HR 92 | Temp 97.6°F | Ht 63.0 in | Wt 243.2 lb

## 2015-07-28 DIAGNOSIS — K7460 Unspecified cirrhosis of liver: Secondary | ICD-10-CM

## 2015-07-28 DIAGNOSIS — R161 Splenomegaly, not elsewhere classified: Secondary | ICD-10-CM | POA: Diagnosis not present

## 2015-07-28 DIAGNOSIS — Z1211 Encounter for screening for malignant neoplasm of colon: Secondary | ICD-10-CM

## 2015-07-28 NOTE — Assessment & Plan Note (Addendum)
Patient with cirrhosis recently diagnosed on CT scan of the abdomen confirmed by ultrasound elastography. Matavir F3-F4. Labs ordered last visit including CBC, CMP, AFP were all normal. Etiologies her liver disease is most likely metabolic syndrome and fatty liver. However, iron and ferritin has not been checked to we ordered at this time. We'll also obtain a CBC, CMP, PTT/INR in order to calculate a meld score and Child Pugh score. Given that she also has splenomegaly we'll set her up for an EGD for variceal screening. Return for follow-up in 3 months for routine care, a day labs and imaging.  Proceed with EGD with Dr. Oneida Alar in near future: the risks, benefits, and alternatives have been discussed with the patient in detail. The patient states understanding and desires to proceed.  The patient is not on any anticoagulants, antidepressants, chronic pain medications, or anxiolytics. She denies alcohol and drug use. Conscious sedation should be adequate for her procedure.

## 2015-07-28 NOTE — Progress Notes (Signed)
Referring Provider: Sinda Du, MD Primary Care Physician:  Alonza Bogus, MD Primary GI:  Dr. Oneida Alar  Chief Complaint  Patient presents with  . Follow-up    HPI:   49 year old female presents for follow-up on cirrhosis and splenomegaly. At last office visit she was seen for the first time for newly diagnosed cirrhosis as an incidental finding on CT scan. Her labs prior to that visit were essentially normal. She was not in a high-risk population for viral hepatitis and was essentially asymptomatic from a GI standpoint. Autoimmune hepatitis labs were normal, hep B and hep C testing were normal, AFP normal at 3.3, CMP results normal and specifically LFTs, bilirubin, alkaline phosphatase. CBC also normal. Ultrasound elastography completed after last visit showed status-post cholecystectomy and high risk for fibrosis with Metavir score F3-F4. At her last visit was determined her cirrhosis is likely due to fatty liver accumulation and metabolic syndrome. She was referred back for follow-up and routine cirrhosis care.    Today she states she's been feeling good. Denies abdominal pain, N/V, yellowing skin and eyes, darkened urine, hematochezia, and melena. She is doing better with blood sugar control. She has been making personal changes including food choices and increased exercise. Her a1c has dropped from 8.5 to 7.0 without medication changes. Energy level is good. Denies chest pain, dyspnea, dizziness, lightheadedness, syncope, near syncope. Denies any other upper or lower GI symptoms.  Past Medical History  Diagnosis Date  . Hypertension   . Type 2 diabetes mellitus ([place])   . Right ureteral stone   . Frequency of urination   . Urgency of urination   . History of kidney stones   . Lupus ([place])     effects joints--  rheumologist-  dr Amil Amen w/ [facility]  . Hypothyroidism   . Wears glasses   . Family history of adverse reaction to anesthesia     mother-- PONV    Past  Surgical History  Procedure Laterality Date  . Cholecystectomy open  age 29's  . Cystoscopy with retrograde pyelogram, ureteroscopy and stent placement Right 04/04/2015    Procedure: CYSTOSCOPY WITH RETROGRADE PYELOGRAM, URETEROSCOPY AND STENT PLACEMENT;  Surgeon: Alexis Frock, MD;  Location: [facility];  Service: Urology;  Laterality: Right;  . Holmium laser application Right [id number]    Procedure: HOLMIUM LASER APPLICATION;  Surgeon: Alexis Frock, MD;  Location: [facility];  Service: Urology;  Laterality: Right;    Current Outpatient Prescriptions  Medication Sig Dispense Refill  . aspirin EC 325 MG tablet Take 325 mg by mouth every evening.    . fenofibrate (TRICOR) 145 MG tablet Take 145 mg by mouth daily.    Marland Kitchen glyBURIDE-metformin (GLUCOVANCE) 5-500 MG per tablet Take 2 tablets by mouth 2 (two) times daily with a meal.    . hydroxychloroquine (PLAQUENIL) 200 MG tablet Take 200 mg by mouth 2 (two) times daily.    Marland Kitchen levothyroxine (LEVOXYL) 25 MCG tablet Take 25 mcg by mouth daily before breakfast.    . levothyroxine (SYNTHROID, LEVOTHROID) 25 MCG tablet Take 25 mcg by mouth daily before breakfast.    . Liraglutide (VICTOZA) 18 MG/3ML SOPN Inject 1.8 mg into the skin every morning.    Marland Kitchen lisinopril (PRINIVIL,ZESTRIL) 5 MG tablet Take 5 mg by mouth every morning.    . metoprolol succinate (TOPROL-XL) 50 MG 24 hr tablet Take 50 mg by mouth every morning. Take with or immediately following a meal.    . niacin (NIASPAN) 500  MG CR tablet Take 500 mg by mouth at bedtime.    . niacin 500 MG tablet Take 500 mg by mouth every morning.     No current facility-administered medications for this visit.    Allergies as of 07/28/2015 - Review Complete 07/28/2015  Allergen Reaction Noted  . Penicillins Hives 10/26/2010    Family History  Problem Relation Age of Onset  . Colon cancer Neg Hx   . Liver disease Mother   . Diabetes type II Mother   . Diabetes  Mellitus II Father   . Hypertension Mother   . Hypertension Father   . Heart disease Father     Social History   Social History  . Marital Status: Married    Spouse Name: N/A  . Number of Children: N/A  . Years of Education: N/A   Social History Main Topics  . Smoking status: Current Every Day Smoker -- 1.00 packs/day for 0 years    Types: Cigarettes  . Smokeless tobacco: Never Used     Comment: one pack daily  . Alcohol Use: No     Comment: Very rarely: typically once every 6+ months; typically 1 drink per occurrence.  . Drug Use: No  . Sexual Activity: Not Asked   Other Topics Concern  . None   Social History Narrative    Review of Systems: 10-point ROS negative except as per HPI.   Physical Exam: BP 135/84 mmHg  Pulse 92  Temp(Src) 97.6 F (36.4 C)  Ht 5\' 3"  (1.6 m)  Wt 243 lb 3.2 oz (110.315 kg)  BMI 43.09 kg/m2 General:   Alert and oriented. Pleasant and cooperative. Well-nourished and well-developed.  Head:  Normocephalic and atraumatic. Eyes:  Without icterus, sclera clear and conjunctiva pink.  Cardiovascular:  S1, S2 present without murmurs appreciated. Extremities without clubbing or edema. Respiratory:  Clear to auscultation bilaterally. No wheezes, rales, or rhonchi. No distress.  Gastrointestinal:  +BS, obese but soft, non-tender and non-distended. No HSM noted. No guarding or rebound. No masses appreciated.  Rectal:  Deferred  Neurologic:  Alert and oriented x4;  grossly normal neurologically. Psych:  Alert and cooperative. Normal mood and affect. Heme/Lymph/Immune: No excessive bruising noted.    07/28/2015 2:10 PM

## 2015-07-28 NOTE — Patient Instructions (Signed)
1. Have your labs drawn when you're able to. 2. We will schedule your procedure for you. 3. Further recommendations to be based on the results of your procedure. 4. Return for follow-up in 3 months.

## 2015-07-28 NOTE — Progress Notes (Signed)
cc'd to pcp 

## 2015-07-28 NOTE — Assessment & Plan Note (Signed)
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 Given her splenomegaly and cirrhosis we will schedule her for variceal screening by upper endoscopy. Return for follow-up in 3 months.

## 2015-08-25 ENCOUNTER — Encounter (HOSPITAL_COMMUNITY): Payer: Self-pay

## 2015-08-25 ENCOUNTER — Telehealth: Payer: Self-pay | Admitting: Gastroenterology

## 2015-08-25 ENCOUNTER — Encounter (HOSPITAL_COMMUNITY)
Admission: RE | Disposition: A | Discharge status: Home / Self Care | Payer: Self-pay | Source: Ambulatory Visit | Attending: Gastroenterology

## 2015-08-25 ENCOUNTER — Ambulatory Visit (HOSPITAL_COMMUNITY)
Admission: RE | Admit: 2015-08-25 | Discharge: 2015-08-25 | Disposition: A | Discharge status: Home / Self Care | Payer: BC Managed Care – PPO | Source: Ambulatory Visit | Attending: Gastroenterology | Admitting: Gastroenterology

## 2015-08-25 DIAGNOSIS — Z9049 Acquired absence of other specified parts of digestive tract: Secondary | ICD-10-CM | POA: Insufficient documentation

## 2015-08-25 DIAGNOSIS — K2960 Other gastritis without bleeding: Secondary | ICD-10-CM | POA: Diagnosis not present

## 2015-08-25 DIAGNOSIS — I10 Essential (primary) hypertension: Secondary | ICD-10-CM | POA: Insufficient documentation

## 2015-08-25 DIAGNOSIS — Z7982 Long term (current) use of aspirin: Secondary | ICD-10-CM | POA: Insufficient documentation

## 2015-08-25 DIAGNOSIS — K222 Esophageal obstruction: Secondary | ICD-10-CM | POA: Diagnosis not present

## 2015-08-25 DIAGNOSIS — K319 Disease of stomach and duodenum, unspecified: Secondary | ICD-10-CM | POA: Insufficient documentation

## 2015-08-25 DIAGNOSIS — E119 Type 2 diabetes mellitus without complications: Secondary | ICD-10-CM | POA: Insufficient documentation

## 2015-08-25 DIAGNOSIS — I8500 Esophageal varices without bleeding: Secondary | ICD-10-CM | POA: Insufficient documentation

## 2015-08-25 DIAGNOSIS — Q394 Esophageal web: Secondary | ICD-10-CM

## 2015-08-25 DIAGNOSIS — K2970 Gastritis, unspecified, without bleeding: Secondary | ICD-10-CM | POA: Diagnosis not present

## 2015-08-25 DIAGNOSIS — F17210 Nicotine dependence, cigarettes, uncomplicated: Secondary | ICD-10-CM | POA: Insufficient documentation

## 2015-08-25 DIAGNOSIS — E039 Hypothyroidism, unspecified: Secondary | ICD-10-CM | POA: Diagnosis not present

## 2015-08-25 DIAGNOSIS — Z87442 Personal history of urinary calculi: Secondary | ICD-10-CM | POA: Insufficient documentation

## 2015-08-25 DIAGNOSIS — Z1211 Encounter for screening for malignant neoplasm of colon: Secondary | ICD-10-CM

## 2015-08-25 HISTORY — PX: ESOPHAGOGASTRODUODENOSCOPY: SHX5428

## 2015-08-25 LAB — GLUCOSE, CAPILLARY: Glucose-Capillary: 205 mg/dL — ABNORMAL HIGH (ref 65–99)

## 2015-08-25 SURGERY — EGD (ESOPHAGOGASTRODUODENOSCOPY)
Anesthesia: Moderate Sedation

## 2015-08-25 MED ORDER — STERILE WATER FOR IRRIGATION IR SOLN
Status: DC | PRN
  Administered 2015-08-25: 09:00:00

## 2015-08-25 MED ORDER — LIDOCAINE VISCOUS 2 % MT SOLN
OROMUCOSAL | Status: DC | PRN
  Administered 2015-08-25: 1 via OROMUCOSAL

## 2015-08-25 MED ORDER — MEPERIDINE HCL 100 MG/ML IJ SOLN
INTRAMUSCULAR | Status: DC | PRN
  Administered 2015-08-25 (×4): 25 mg via INTRAVENOUS

## 2015-08-25 MED ORDER — LIDOCAINE VISCOUS 2 % MT SOLN
OROMUCOSAL | Status: AC
  Filled 2015-08-25: qty 15

## 2015-08-25 MED ORDER — ACETAMINOPHEN 325 MG PO TABS: 650.0000 mg | ORAL_TABLET | Freq: Three times a day (TID) | ORAL | Status: DC | PRN

## 2015-08-25 MED ORDER — SODIUM CHLORIDE 0.9 % IV SOLN
INTRAVENOUS | Status: DC
  Administered 2015-08-25: 08:00:00 via INTRAVENOUS

## 2015-08-25 MED ORDER — MEPERIDINE HCL 100 MG/ML IJ SOLN
INTRAMUSCULAR | Status: AC
  Filled 2015-08-25: qty 2

## 2015-08-25 MED ORDER — MIDAZOLAM HCL 5 MG/5ML IJ SOLN
INTRAMUSCULAR | Status: AC
  Filled 2015-08-25: qty 10

## 2015-08-25 MED ORDER — OMEPRAZOLE 20 MG PO CPDR: DELAYED_RELEASE_CAPSULE | ORAL | Status: DC

## 2015-08-25 MED ORDER — MIDAZOLAM HCL 5 MG/5ML IJ SOLN
INTRAMUSCULAR | Status: DC | PRN
  Administered 2015-08-25 (×2): 2 mg via INTRAVENOUS
  Administered 2015-08-25: 1 mg via INTRAVENOUS

## 2015-08-25 NOTE — Discharge Instructions (Signed)
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 You have EROSIVE gastritis DUE TO ASPIRIN USE. I biopsied your stomach.   DRINK WATER TO KEEP YOUR URINE LIGHT YELLOW.  CONTINUE YOUR WEIGHT LOSS EFFORTS. LOSE 10 LBS. I WILL REFER YOU TO DR. GOSRANI FOR WEIGHT MANAGEMENT.  YOU SHOULD ADD OMEPRAZOLE 30 MINUTES PRIOR TO MEALS TWICE DAILY FOR 3 MOS THEN ONCE DAILY FOREVER TO PREVENT ULCERS AND GASTRITIS WHILE TAKING ASPIRIN.  YOU MAY USE TYLENOL AS NEEDED FOR PAIN.  YOUR BIOPSY RESULTS WILL BE AVAILABLE IN MY CHART WED DEC 21 AND MY OFFICE WILL CONTACT YOU IN 10-14 DAYS WITH YOUR RESULTS.    CONTINUE A LOW FAT/DIABETIC DIET.   FOLLOW UP IN JUN 2017.  NEXT UPPER ENDOSCOPY IN 1-2 YEARS.   UPPER ENDOSCOPY AFTER CARE Read the instructions outlined below and refer to this sheet in the next week. These discharge instructions provide you with general information on caring for yourself after you leave the hospital. While your treatment has been planned according to the most current medical practices available, unavoidable complications occasionally occur. If you have any problems or questions after discharge, call DR. Symphanie Cederberg, [phone].  ACTIVITY  You may resume your regular activity, but move at a slower pace for the next 24 hours.   Take frequent rest periods for the next 24 hours.   Walking will help get rid of the air and reduce the bloated feeling in your belly (abdomen).   No driving for 24 hours (because of the medicine (anesthesia) used during the test).   You may shower.   Do not sign any important legal documents or operate any machinery for 24 hours (because of the anesthesia used during the test).    NUTRITION  Drink plenty of fluids.   You may resume your normal diet as instructed by your doctor.   Begin with a light meal and progress to your normal diet. Heavy or fried foods are harder to digest and may make you feel sick to your stomach (nauseated).    Avoid alcoholic beverages for 24 hours or as instructed.    MEDICATIONS  You may resume your normal medications.   WHAT YOU CAN EXPECT TODAY  Some feelings of bloating in the abdomen.   Passage of more gas than usual.    IF YOU HAD A BIOPSY TAKEN DURING THE UPPER ENDOSCOPY:  Eat a soft diet IF YOU HAVE NAUSEA, BLOATING, ABDOMINAL PAIN, OR VOMITING.    FINDING OUT THE RESULTS OF YOUR TEST Not all test results are available during your visit. DR. Oneida Alar WILL CALL YOU WITHIN 14 DAYS OF YOUR PROCEDUE WITH YOUR RESULTS. Do not assume everything is normal if you have not heard from DR. Kutler Vanvranken, CALL HER OFFICE AT [phone].  SEEK IMMEDIATE MEDICAL ATTENTION AND CALL THE OFFICE: [phone] IF:  You have more than a spotting of blood in your stool.   Your belly is swollen (abdominal distention).   You are nauseated or vomiting.   You have a temperature over 101F.   You have abdominal pain or discomfort that is severe or gets worse throughout the day.   Gastritis  Gastritis is an inflammation (the body's way of reacting to injury and/or infection) of the stomach. It is often caused by viral or bacterial (germ) infections. It can also be caused BY ASPIRIN, BC/GOODY POWDER'S, (IBUPROFEN) MOTRIN, OR ALEVE (NAPROXEN), chemicals (including alcohol), SPICY FOODS, and medications. This illness may be associated with generalized malaise (feeling tired,  not well), UPPER ABDOMINAL STOMACH cramps, and fever. One common bacterial cause of gastritis is an organism known as H. Pylori. This can be treated with antibiotics.   Esophageal Varices   The esophagus is the passage that connects the throat to the stomach. Esophageal varices are blood vessels in the esophagus that have become enlarged. They develop when extra blood is forced to flow through them because the blood's normal pathway is blocked. Without treatment these blood vessels eventually break and bleed (hemorrhage). A hemorrhage  is life-threatening.   CAUSES  This condition may be caused by:  Scarring of the liver (cirrhosis) due to FAT DEPOSTS IN THE LIVER.  Liver disease.  Severe heart failure.  A blood clot in the portal vein.   SYMPTOMS  Usually there are no symptoms unless the esophageal varices bleed. Symptoms of bleeding esophageal varices include:  Vomiting material that is bright red or that is black and looks like coffee grounds.   Black, tarry stools.  Dizziness or lightheadedness.  Low blood pressure.  Loss of consciousness.  DIAGNOSIS  This condition is diagnosed with tests, such as:  Endoscopy. During this test a thin, lighted tube is inserted through the mouth and into the esophagus.  Blood tests. These may be done to check liver function, blood counts, and the body's ability to form blood clots.  TREATMENT  This condition may be treated with:  Medicines that reduce pressure in the esophageal varices and reduce the risk of bleeding.  Procedures to reduce pressure in the esophageal varices and reduce the risk of bleeding or stop bleeding. These include:  Variceal ligation. In this procedure, a rubber band is placed around the esophageal varices to keep them from bleeding.  Injection therapy. This treatment involves an injection that causes the esophageal varices to shrink and close (sclerotherapy). Medicines that tighten blood vessels or alter blood flow may also be used.  A liver transplant. This may be done if other treatments do not work.  HOME CARE INSTRUCTIONS  Take medicines only as directed by your health care provider.  Follow your health care provider's instructions about rest and physical activity.  SEEK IMMEDIATE MEDICAL CARE IF:  You have any symptoms of this condition after treatment.  You are unable to eat or drink.  You have chest pain.

## 2015-08-25 NOTE — Op Note (Signed)
[place], [address]   ENDOSCOPY PROCEDURE REPORT  PATIENT: Chelsea Estrada, Chelsea Estrada  MR#: [id number] BIRTHDATE: [birth year] , 49  yrs. old GENDER: female  ENDOSCOPIST: Danie Binder, MD REFERRED QP:[id number] Hawkins, M.D. PROCEDURE DATE: 21-Sep-2015 PROCEDURE:   EGD w/ biopsy  INDICATIONS:screening for varices. ASA DAILY W/O PPI. AUG 2016: NL PLT CT AND Cr. MEDICATIONS: Demerol 100 mg IV and Versed 5 mg IV   TOPICAL ANESTHETIC:   Viscous Xylocaine   ASA CLASS:  DESCRIPTION OF PROCEDURE:     Physical exam was performed.  Informed consent was obtained from the patient after explaining the benefits, risks, and alternatives to the procedure.  The patient was connected to the monitor and placed in the left lateral position.  Continuous oxygen was provided by nasal cannula and IV medicine administered through an indwelling cannula.  After administration of sedation, the patients esophagus was intubated and the [id number]  endoscope was advanced under direct visualization to the second portion of the duodenum.  The scope was removed slowly by carefully examining the color, texture, anatomy, and integrity of the mucosa on the way out.  The patient was recovered in endoscopy and discharged home in satisfactory condition.  Estimated blood loss is zero unless otherwise noted in this procedure report.     ESOPHAGUS: 2 COLUMNS GRADE I VARICES.  PATENT SCHATZKI'S RING. STOMACH: Moderate erosive gastritis (inflammation) was found in the gastric antrum and gastric body.  Multiple biopsies were performed using cold forceps.   NO VARICES IN THE CARDIA, FUNDUS, OR BULB. DUODENUM: The duodenal mucosa showed no abnormalities in the bulb and 2nd part of the duodenum.        COMPLICATIONS: There were no immediate complications.  ENDOSCOPIC IMPRESSION: 1.   GRADE I VARICES.  PATENT SCHATZKI'S RING 2.   MODERATE Erosive gastritis   DUE TO ASA  USE RECOMMENDATIONS: DRINK WATER TO KEEP YOUR URINE LIGHT YELLOW. CONTINUE WEIGHT LOSS EFFORTS.  REFER TO DR.  [place]. ADD OMEPRAZOLE 30 MIN PRIOR TO MEALS BID FOR 3 MOS THEN ONCE DAILY FOREVER TO PREVENT PUD/GASTRITIS TAKING ASPIRIN. MAY USE TYLENOL AS NEEDED FOR PAIN. AWAIT BIOPSY RESULTS. CONTINUE A LOW FAT/DIABETIC DIET. IMAGNG AND LABS IN FEB 2017. FOLLOW UP IN JUN 2017. NEXT UPPER ENDOSCOPY IN 2 YEARS.  REPEAT EXAM: eSigned:  Danie Binder, MD 09-21-15 2:25 PM  CPT CODES: ICD CODES:  The ICD and CPT codes recommended by this software are interpretations from the data that the clinical staff has captured with the software.  The verification of the translation of this report to the ICD and CPT codes and modifiers is the sole responsibility of the health care institution and practicing physician where this report was generated.  [place]. will not be held responsible for the validity of the ICD and CPT codes included on this report.  AMA assumes no liability for data contained or not contained herein. CPT is a Designer, television/film set of the [employer].

## 2015-08-25 NOTE — Telephone Encounter (Signed)
REFER TO DR. [place], BMI > 40, NASH CIRRHOSIS, DIABETES, HTN. RUQ U/S TO SCREEN FOR [facility] FEB 2017 CMP/CBC/PT/INR/AFP FEB 2017. NEXT EGD IN 2 YEARS-VARICEAL SCREENING TCS IN 2 YEARS-COLON CANCER SCREENING

## 2015-08-25 NOTE — H&P (Signed)
Primary Care Physician:  Alonza Bogus, MD Primary Gastroenterologist:  Dr. Oneida Alar  Pre-Procedure History & Physical: HPI:  Chelsea Estrada is a 49 y.o. female here for variceal screening.  Past Medical History  Diagnosis Date  . Hypertension   . Type 2 diabetes mellitus ([place])   . Right ureteral stone   . Frequency of urination   . Urgency of urination   . History of kidney stones   . Lupus ([place])     effects joints--  rheumologist-  dr Amil Amen w/ [facility]  . Hypothyroidism   . Wears glasses   . Family history of adverse reaction to anesthesia     mother-- PONV    Past Surgical History  Procedure Laterality Date  . Cholecystectomy open  age 74's  . Cystoscopy with retrograde pyelogram, ureteroscopy and stent placement Right 04/04/2015    Procedure: CYSTOSCOPY WITH RETROGRADE PYELOGRAM, URETEROSCOPY AND STENT PLACEMENT;  Surgeon: Alexis Frock, MD;  Location: [facility];  Service: Urology;  Laterality: Right;  . Holmium laser application Right [id number]    Procedure: HOLMIUM LASER APPLICATION;  Surgeon: Alexis Frock, MD;  Location: [facility]  Service: Urology;  Laterality: Right;    Prior to Admission medications   Medication Sig Start Date End Date Taking? Authorizing Provider  aspirin EC 325 MG tablet Take 325 mg by mouth every evening.   Yes Historical Provider, MD  fenofibrate (TRICOR) 145 MG tablet Take 145 mg by mouth daily.   Yes Historical Provider, MD  glyBURIDE-metformin (GLUCOVANCE) 5-500 MG per tablet Take 2 tablets by mouth 2 (two) times daily with a meal.   Yes Historical Provider, MD  hydroxychloroquine (PLAQUENIL) 200 MG tablet Take 200 mg by mouth 2 (two) times daily.   Yes Historical Provider, MD  levothyroxine (LEVOXYL) 25 MCG tablet Take 25 mcg by mouth daily before breakfast.   Yes Historical Provider, MD  Liraglutide (VICTOZA) 18 MG/3ML SOPN Inject 1.8 mg into the skin every morning.   Yes Historical  Provider, MD  lisinopril (PRINIVIL,ZESTRIL) 5 MG tablet Take 5 mg by mouth every morning.   Yes Historical Provider, MD  metoprolol succinate (TOPROL-XL) 50 MG 24 hr tablet Take 50 mg by mouth every morning. Take with or immediately following a meal.   Yes Historical Provider, MD  niacin (NIASPAN) 500 MG CR tablet Take 500 mg by mouth at bedtime.   Yes Historical Provider, MD  niacin 500 MG tablet Take 500 mg by mouth every morning.   Yes Historical Provider, MD  levothyroxine (SYNTHROID, LEVOTHROID) 25 MCG tablet Take 25 mcg by mouth daily before breakfast.    Historical Provider, MD    Allergies as of 07/28/2015 - Review Complete 07/28/2015  Allergen Reaction Noted  . Penicillins Hives 10/26/2010    Family History  Problem Relation Age of Onset  . Colon cancer Neg Hx   . Liver disease Mother   . Diabetes type II Mother   . Diabetes Mellitus II Father   . Hypertension Mother   . Hypertension Father   . Heart disease Father     Social History   Social History  . Marital Status: Married    Spouse Name: N/A  . Number of Children: N/A  . Years of Education: N/A   Occupational History  . Not on file.   Social History Main Topics  . Smoking status: Current Every Day Smoker -- 1.00 packs/day for 0 years    Types: Cigarettes  . Smokeless tobacco: Never  Used     Comment: one pack daily  . Alcohol Use: No     Comment: Very rarely: typically once every 6+ months; typically 1 drink per occurrence.  . Drug Use: No  . Sexual Activity: Not on file   Other Topics Concern  . Not on file   Social History Narrative    Review of Systems: See HPI, otherwise negative ROS   Physical Exam: BP 101/55 mmHg  Pulse 84  Temp(Src) 98.4 F (36.9 C) (Oral)  Resp 12  Ht 5\' 3"  (1.6 m)  Wt 243 lb (110.224 kg)  BMI 43.06 kg/m2  SpO2 99% General:   Alert,  pleasant and cooperative in NAD Head:  Normocephalic and atraumatic. Neck:  Supple; Lungs:  Clear throughout to auscultation.     Heart:  Regular rate and rhythm. Abdomen:  Soft, nontender and nondistended. Normal bowel sounds, without guarding, and without rebound.   Neurologic:  Alert and  oriented x4;  grossly normal neurologically.  Impression/Plan:     SCREENING VARICES  PLAN:  1.EGD TODAY

## 2015-08-26 NOTE — Telephone Encounter (Signed)
ON RECALL  °

## 2015-08-26 NOTE — Telephone Encounter (Signed)
Referral has been made  NIC for EGD/TCS in two years  Blood work and RUQ [place] to screen for [facility] in 10/2015

## 2015-09-02 ENCOUNTER — Encounter (HOSPITAL_COMMUNITY): Payer: Self-pay | Admitting: Gastroenterology

## 2015-09-09 ENCOUNTER — Telehealth: Payer: Self-pay | Admitting: Gastroenterology

## 2015-09-09 NOTE — Telephone Encounter (Signed)
Please call pt. HER stomach Bx shows gastritis.    DRINK WATER TO KEEP URINE LIGHT YELLOW.  CONTINUE YOUR WEIGHT LOSS EFFORTS. LOSE 10 LBS. I WILL REFER YOU TO DR. GOSRANI FOR WEIGHT MANAGEMENT.  TAKE OMEPRAZOLE 30 MINUTES PRIOR TO MEALS TWICE DAILY FOR 3 MOS THEN ONCE DAILY FOREVER TO PREVENT ULCERS AND GASTRITIS WHILE TAKING ASPIRIN.  YOU MAY USE TYLENOL AS NEEDED FOR PAIN.  CONTINUE A LOW FAT/DIABETIC DIET.  CMP/CBC/PT/INR/AFP & RUQ U/S TO SCREEN FOR [place] FEB 2017  FOLLOW UP IN JUN 2017 E30 CIRRHOSIS.  NEXT EGD IN 2 YEARS-VARICEAL SCREENING  NEXT TCS IN 2 YEARS-COLON CANCER SCREENING

## 2015-09-10 ENCOUNTER — Other Ambulatory Visit: Payer: Self-pay

## 2015-09-10 DIAGNOSIS — K7460 Unspecified cirrhosis of liver: Secondary | ICD-10-CM

## 2015-09-10 NOTE — Telephone Encounter (Signed)
Reminder in epic °

## 2015-09-10 NOTE — Telephone Encounter (Signed)
Pt is aware. Lab orders on file for Feb 2017.

## 2015-09-12 ENCOUNTER — Other Ambulatory Visit: Payer: Self-pay

## 2015-09-12 DIAGNOSIS — K7460 Unspecified cirrhosis of liver: Secondary | ICD-10-CM

## 2015-09-13 MED ORDER — OMEPRAZOLE 20 MG PO CPDR: DELAYED_RELEASE_CAPSULE | ORAL | Status: DC

## 2015-09-22 ENCOUNTER — Telehealth: Payer: Self-pay | Admitting: Gastroenterology

## 2015-09-22 NOTE — Telephone Encounter (Signed)
Mailed letter °

## 2015-09-22 NOTE — Telephone Encounter (Signed)
PATIENT ON RECALL FOR ULTRASOUND AND BLOODWORK

## 2015-10-10 ENCOUNTER — Other Ambulatory Visit: Payer: Self-pay | Admitting: Nurse Practitioner

## 2015-10-11 LAB — COMPREHENSIVE METABOLIC PANEL
ALBUMIN: 3.8 g/dL (ref 3.5–5.5)
ALK PHOS: 83 IU/L (ref 39–117)
ALT: 21 IU/L (ref 0–32)
AST: 31 IU/L (ref 0–40)
Albumin/Globulin Ratio: 1.4 (ref 1.1–2.5)
BILIRUBIN TOTAL: 0.4 mg/dL (ref 0.0–1.2)
BUN/Creatinine Ratio: 12 (ref 9–23)
BUN: 8 mg/dL (ref 6–24)
CHLORIDE: 98 mmol/L (ref 96–106)
CO2: 23 mmol/L (ref 18–29)
Calcium: 9.2 mg/dL (ref 8.7–10.2)
Creatinine, Ser: 0.65 mg/dL (ref 0.57–1.00)
GFR calc non Af Amer: 105 mL/min/{1.73_m2} (ref >59)
GFR, EST AFRICAN AMERICAN: 121 mL/min/{1.73_m2} (ref >59)
GLOBULIN, TOTAL: 2.8 g/dL (ref 1.5–4.5)
GLUCOSE: 180 mg/dL — AB (ref 65–99)
Potassium: 4 mmol/L (ref 3.5–5.2)
Sodium: 138 mmol/L (ref 134–144)
Total Protein: 6.6 g/dL (ref 6.0–8.5)

## 2015-10-11 LAB — CBC WITH DIFFERENTIAL/PLATELET

## 2015-10-11 LAB — PROTIME-INR
INR: 1 (ref 0.8–1.2)
Prothrombin Time: 10.3 s (ref 9.1–12.0)

## 2015-10-11 LAB — IRON AND TIBC
IRON SATURATION: 14 % — AB (ref 15–55)
IRON: 49 ug/dL (ref 27–159)
TIBC: 342 ug/dL (ref 250–450)
UIBC: 293 ug/dL (ref 131–425)

## 2015-10-11 LAB — FERRITIN: FERRITIN: 30 ng/mL (ref 15–150)

## 2015-10-13 ENCOUNTER — Telehealth: Payer: Self-pay

## 2015-10-13 NOTE — Telephone Encounter (Signed)
Labs from [facility] back with HA1C- 7.4. Copy in EG box

## 2015-10-14 NOTE — Telephone Encounter (Signed)
Noted  

## 2015-10-28 ENCOUNTER — Ambulatory Visit: Payer: BC Managed Care – PPO | Admitting: Nurse Practitioner

## 2016-01-16 ENCOUNTER — Other Ambulatory Visit: Payer: Self-pay | Admitting: Nurse Practitioner

## 2016-01-17 LAB — CBC WITH DIFFERENTIAL/PLATELET
BASOS ABS: 0 10*3/uL (ref 0.0–0.2)
Basos: 0 %
EOS (ABSOLUTE): 0.1 10*3/uL (ref 0.0–0.4)
Eos: 2 %
HEMOGLOBIN: 13.4 g/dL (ref 11.1–15.9)
Hematocrit: 40.5 % (ref 34.0–46.6)
Immature Grans (Abs): 0 10*3/uL (ref 0.0–0.1)
Immature Granulocytes: 0 %
LYMPHS ABS: 2 10*3/uL (ref 0.7–3.1)
LYMPHS: 33 %
MCH: 31.5 pg (ref 26.6–33.0)
MCHC: 33.1 g/dL (ref 31.5–35.7)
MCV: 95 fL (ref 79–97)
MONOCYTES: 11 %
MONOS ABS: 0.6 10*3/uL (ref 0.1–0.9)
NEUTROS ABS: 3.3 10*3/uL (ref 1.4–7.0)
NEUTROS PCT: 54 %
Platelets: 145 10*3/uL — ABNORMAL LOW (ref 150–379)
RBC: 4.25 x10E6/uL (ref 3.77–5.28)
RDW: 13.2 % (ref 12.3–15.4)
WBC: 6 10*3/uL (ref 3.4–10.8)

## 2016-01-19 ENCOUNTER — Telehealth: Payer: Self-pay

## 2016-01-19 NOTE — Telephone Encounter (Signed)
[facility] labs place on [facility] Sites desk for review

## 2016-01-20 ENCOUNTER — Telehealth: Payer: Self-pay

## 2016-01-20 NOTE — Telephone Encounter (Signed)
error 

## 2016-01-22 NOTE — Progress Notes (Signed)
Quick Note:  LMOM to call. ______ 

## 2016-01-27 ENCOUNTER — Other Ambulatory Visit: Payer: Self-pay

## 2016-01-27 DIAGNOSIS — K7460 Unspecified cirrhosis of liver: Secondary | ICD-10-CM

## 2016-01-27 NOTE — Progress Notes (Signed)
Quick Note:  Pt is aware and I re-entered the labs that ae not done. She wants to go to [facility] and I have faxed the orders to them. ______

## 2016-01-27 NOTE — Progress Notes (Signed)
Quick Note:  LM to call. ______ 

## 2016-02-25 ENCOUNTER — Encounter: Payer: Self-pay | Admitting: Nurse Practitioner

## 2016-02-25 ENCOUNTER — Ambulatory Visit (INDEPENDENT_AMBULATORY_CARE_PROVIDER_SITE_OTHER): Payer: BC Managed Care – PPO | Admitting: Nurse Practitioner

## 2016-02-25 VITALS — BP 123/78 | HR 78 | Temp 96.9°F | Ht 63.0 in | Wt 242.0 lb

## 2016-02-25 DIAGNOSIS — K7460 Unspecified cirrhosis of liver: Secondary | ICD-10-CM | POA: Diagnosis not present

## 2016-02-25 DIAGNOSIS — R161 Splenomegaly, not elsewhere classified: Secondary | ICD-10-CM

## 2016-02-25 NOTE — Progress Notes (Signed)
cc'ed to pcp °

## 2016-02-25 NOTE — Progress Notes (Signed)
Referring Provider: Sinda Du, MD Primary Care Physician:  Alonza Bogus, MD Primary GI:  Dr. Oneida Alar  Chief Complaint  Patient presents with  . Follow-up    HPI:   Chelsea Estrada is a 50 y.o. female who presents for follow-up on cirrhosis and splenomegaly. She was last seen in our office 07/28/2015 for the same. At that time she stated she was doing well, no overt GI symptoms, doing better blood sugar control and making personal changes including food choices and increased exercise. She had an improvement in her A1c from 8.5-7.0 just prior to her visit with [place]. History of cirrhosis on CT scan performed by ultrasound elastography with Matavir F3/F4. She was arranged for upper endoscopy for variceal screening as well as typical labs. Was likely etiology for her liver disease is fatty liver/metabolic syndrome. EGD was completed on 19 2016 which found grade 1 varices and patent Schatzki's ring, moderate erosive gastritis due to aspirin use. Recommended continued weight loss, referred to Dr. Rada Hay for wellness, omeprazole twice a day for 3 months then once daily forever to prevent PUD/gastritis on aspirin. Imaging and labs February 2017, follow-up in June 2017. Next upper endoscopy in 2 years. Labs were completed in February 2017 which showed normal ferritin, normal CMP, normal INR 1.0. MELD 6, Child-Pugh A. No recent abdominal imaging, currently due.  Today she states she's doing well overall. Denies abdominal pain, N/V, hematochezia, melena, yellowing of the skin/eyes, darkened urine, acute episodic confusion, tremors. Was having some increased loose stools, Dr. Luan Pulling checked stool studies which were normal. She attributes it to increased stress especially with her dad's recent passing in March of this year unexpectedly likely from CVA after a week of hospitalization. Denies chest pain, dyspnea, dizziness, lightheadedness, syncope, near syncope. Denies any other upper or lower GI  symptoms.   She states her a1c is down below 7.  Past Medical History  Diagnosis Date  . Hypertension   . Type 2 diabetes mellitus ([place])   . Right ureteral stone   . Frequency of urination   . Urgency of urination   . History of kidney stones   . Lupus ([place])     effects joints--  rheumologist-  dr Amil Amen w/ [facility]  . Hypothyroidism   . Wears glasses   . Family history of adverse reaction to anesthesia     mother-- PONV    Past Surgical History  Procedure Laterality Date  . Cholecystectomy open  age 28's  . Cystoscopy with retrograde pyelogram, ureteroscopy and stent placement Right 04/04/2015    Procedure: CYSTOSCOPY WITH RETROGRADE PYELOGRAM, URETEROSCOPY AND STENT PLACEMENT;  Surgeon: Alexis Frock, MD;  Location: [facility];  Service: Urology;  Laterality: Right;  . Holmium laser application Right [id number]    Procedure: HOLMIUM LASER APPLICATION;  Surgeon: Alexis Frock, MD;  Location[facility];  Service: Urology;  Laterality: Right;  . Esophagogastroduodenoscopy N/A 08/25/2015    Procedure: ESOPHAGOGASTRODUODENOSCOPY (EGD);  Surgeon: Danie Binder, MD;  [facility];  Service: Endoscopy;  Laterality: N/A;  830    Current Outpatient Prescriptions  Medication Sig Dispense Refill  . acetaminophen (TYLENOL) 325 MG tablet Take 2 tablets (650 mg total) by mouth every 8 (eight) hours as needed for mild pain or moderate pain. NO MORE THAN 6 TABLETS A DAY 500 tablet 0  . aspirin EC 325 MG tablet Take 325 mg by mouth every evening.    . fenofibrate (TRICOR) 145 MG tablet  Take 145 mg by mouth daily.    Marland Kitchen glyBURIDE-metformin (GLUCOVANCE) 5-500 MG per tablet Take 2 tablets by mouth 2 (two) times daily with a meal.    . hydroxychloroquine (PLAQUENIL) 200 MG tablet Take 200 mg by mouth 2 (two) times daily.    Marland Kitchen levothyroxine (LEVOXYL) 25 MCG tablet Take 25 mcg by mouth daily before breakfast.    . levothyroxine (SYNTHROID,  LEVOTHROID) 25 MCG tablet Take 25 mcg by mouth daily before breakfast.    . Liraglutide (VICTOZA) 18 MG/3ML SOPN Inject 1.8 mg into the skin every morning.    Marland Kitchen lisinopril (PRINIVIL,ZESTRIL) 5 MG tablet Take 5 mg by mouth every morning.    . metoprolol succinate (TOPROL-XL) 50 MG 24 hr tablet Take 50 mg by mouth every morning. Take with or immediately following a meal.    . niacin (NIASPAN) 500 MG CR tablet Take 500 mg by mouth at bedtime.    . niacin 500 MG tablet Take 500 mg by mouth every morning.    Marland Kitchen omeprazole (PRILOSEC) 20 MG capsule 1 PO 30 mins prior to breakfast and supper (Patient taking differently: daily. 1 PO 30 mins prior to breakfast and supper) 60 capsule 3   No current facility-administered medications for this visit.    Allergies as of 02/25/2016 - Review Complete 02/25/2016  Allergen Reaction Noted  . Penicillins Hives 10/26/2010    Family History  Problem Relation Age of Onset  . Colon cancer Neg Hx   . Liver disease Mother   . Diabetes type II Mother   . Diabetes Mellitus II Father   . Hypertension Mother   . Hypertension Father   . Heart disease Father     Social History   Social History  . Marital Status: Married    Spouse Name: N/A  . Number of Children: N/A  . Years of Education: N/A   Social History Main Topics  . Smoking status: Current Every Day Smoker -- 1.00 packs/day for 0 years    Types: Cigarettes  . Smokeless tobacco: Never Used     Comment: one pack daily  . Alcohol Use: No     Comment: Very rarely: typically once every 6+ months; typically 1 drink per occurrence.  . Drug Use: No  . Sexual Activity: Not Asked   Other Topics Concern  . None   Social History Narrative    Review of Systems: 10-point ROS negative except as per HPI.   Physical Exam: BP 123/78 mmHg  Pulse 78  Temp(Src) 96.9 F (36.1 C)  Ht 5\' 3"  (1.6 m)  Wt 242 lb (109.77 kg)  BMI 42.88 kg/m2 General:   Obese female. Alert and oriented. Pleasant and  cooperative. Well-nourished and well-developed.  Head:  Normocephalic and atraumatic. Eyes:  Without icterus, sclera clear and conjunctiva pink.  Ears:  Normal auditory acuity. Cardiovascular:  S1, S2 present without murmurs appreciated. Extremities without clubbing or edema. Respiratory:  Clear to auscultation bilaterally. No wheezes, rales, or rhonchi. No distress.  Gastrointestinal:  +BS, soft, non-tender and non-distended. Hepatomegaly with liver border appreciated 1-2 fingerbreadths below the right costal margin. No guarding or rebound. No masses appreciated.  Rectal:  Deferred  Musculoskalatal:  Symmetrical without gross deformities. Skin:  Intact without significant lesions or rashes. Neurologic:  Alert and oriented x4;  grossly normal neurologically. Psych:  Alert and cooperative. Normal mood and affect. Heme/Lymph/Immune: No excessive bruising noted.    02/25/2016 1:35 PM   Disclaimer: This note was dictated with voice recognition  software. Similar sounding words can inadvertently be transcribed and may not be corrected upon review.

## 2016-02-25 NOTE — Assessment & Plan Note (Signed)
Splenic megaly on imaging with last platelet count marginally low at 145. Is due for labs and a couple months. Continue to monitor, return for follow-up in 6 months.

## 2016-02-25 NOTE — Patient Instructions (Signed)
1. We will help you schedule your ultrasound. 2. We will try and call to remind you to have your labs drawn at the end of August. 3. Return for follow-up in 6 months.

## 2016-02-25 NOTE — Assessment & Plan Note (Signed)
Patient diagnosis cirrhosis on imaging. Ultrasound elastography on file with F3, some F4. Most likely etiology fatty liver. She is doing quite well with her diet and exercise. Her hemoglobin A1c is down below 7 wears initially it was at 8.5. She continues her weight loss efforts, diet and exercise. However, this is been somewhat more difficult recently with the recent passing of her father in March of this year and subsequent grieving and handling family affairs. She is to be commended for her efforts however. She'll be due for labs at the end of August including CBC, CMP, PT/INR, AFP. I wonder this today and have her next to remind her to have them drawn. She is currently due for ultrasound for hepatoma screening. Return for follow-up in 6 months.

## 2016-03-02 ENCOUNTER — Ambulatory Visit (HOSPITAL_COMMUNITY)
Admission: RE | Admit: 2016-03-02 | Discharge: 2016-03-02 | Disposition: A | Discharge status: Home / Self Care | Payer: BC Managed Care – PPO | Source: Ambulatory Visit | Attending: Nurse Practitioner | Admitting: Nurse Practitioner

## 2016-03-02 DIAGNOSIS — Z9049 Acquired absence of other specified parts of digestive tract: Secondary | ICD-10-CM | POA: Insufficient documentation

## 2016-03-02 DIAGNOSIS — R161 Splenomegaly, not elsewhere classified: Secondary | ICD-10-CM | POA: Insufficient documentation

## 2016-03-02 DIAGNOSIS — K7460 Unspecified cirrhosis of liver: Secondary | ICD-10-CM | POA: Insufficient documentation

## 2016-03-02 NOTE — Progress Notes (Signed)
Quick Note:  LM for a return call. ______ 

## 2016-03-08 ENCOUNTER — Telehealth: Payer: Self-pay | Admitting: Gastroenterology

## 2016-03-08 NOTE — Telephone Encounter (Signed)
AUGUST RECALL FOR LABS

## 2016-03-10 NOTE — Telephone Encounter (Signed)
I have it ready to mail to her closer to time.

## 2016-07-19 ENCOUNTER — Other Ambulatory Visit (HOSPITAL_COMMUNITY): Payer: Self-pay | Admitting: Pulmonary Disease

## 2016-07-19 DIAGNOSIS — Z1231 Encounter for screening mammogram for malignant neoplasm of breast: Secondary | ICD-10-CM

## 2016-07-22 ENCOUNTER — Other Ambulatory Visit: Payer: Self-pay | Admitting: Nurse Practitioner

## 2016-07-23 ENCOUNTER — Ambulatory Visit (HOSPITAL_COMMUNITY)
Admission: RE | Admit: 2016-07-23 | Discharge: 2016-07-23 | Disposition: A | Discharge status: Home / Self Care | Payer: BC Managed Care – PPO | Source: Ambulatory Visit | Attending: Pulmonary Disease | Admitting: Pulmonary Disease

## 2016-07-23 DIAGNOSIS — Z1231 Encounter for screening mammogram for malignant neoplasm of breast: Secondary | ICD-10-CM | POA: Diagnosis not present

## 2016-07-23 LAB — COMPREHENSIVE METABOLIC PANEL
A/G RATIO: 1.3 (ref 1.2–2.2)
ALK PHOS: 109 IU/L (ref 39–117)
ALT: 21 IU/L (ref 0–32)
AST: 31 IU/L (ref 0–40)
Albumin: 4.1 g/dL (ref 3.5–5.5)
BILIRUBIN TOTAL: 0.5 mg/dL (ref 0.0–1.2)
BUN/Creatinine Ratio: 15 (ref 9–23)
BUN: 10 mg/dL (ref 6–24)
CHLORIDE: 99 mmol/L (ref 96–106)
CO2: 21 mmol/L (ref 18–29)
Calcium: 9.3 mg/dL (ref 8.7–10.2)
Creatinine, Ser: 0.66 mg/dL (ref 0.57–1.00)
GFR calc non Af Amer: 104 mL/min/{1.73_m2} (ref >59)
GFR, EST AFRICAN AMERICAN: 120 mL/min/{1.73_m2} (ref >59)
GLUCOSE: 187 mg/dL — AB (ref 65–99)
Globulin, Total: 3.1 g/dL (ref 1.5–4.5)
POTASSIUM: 4.2 mmol/L (ref 3.5–5.2)
Sodium: 138 mmol/L (ref 134–144)
Total Protein: 7.2 g/dL (ref 6.0–8.5)

## 2016-07-23 LAB — CBC WITH DIFFERENTIAL/PLATELET
BASOS ABS: 0 10*3/uL (ref 0.0–0.2)
BASOS: 0 %
EOS (ABSOLUTE): 0.2 10*3/uL (ref 0.0–0.4)
Eos: 2 %
Hematocrit: 42.1 % (ref 34.0–46.6)
Hemoglobin: 14 g/dL (ref 11.1–15.9)
Immature Grans (Abs): 0 10*3/uL (ref 0.0–0.1)
Immature Granulocytes: 0 %
LYMPHS: 30 %
Lymphocytes Absolute: 2 10*3/uL (ref 0.7–3.1)
MCH: 31 pg (ref 26.6–33.0)
MCHC: 33.3 g/dL (ref 31.5–35.7)
MCV: 93 fL (ref 79–97)
MONOS ABS: 0.6 10*3/uL (ref 0.1–0.9)
Monocytes: 9 %
NEUTROS ABS: 4 10*3/uL (ref 1.4–7.0)
Neutrophils: 59 %
PLATELETS: 155 10*3/uL (ref 150–379)
RBC: 4.51 x10E6/uL (ref 3.77–5.28)
RDW: 13.1 % (ref 12.3–15.4)
WBC: 6.8 10*3/uL (ref 3.4–10.8)

## 2016-07-23 LAB — PROTIME-INR
INR: 1 (ref 0.8–1.2)
PROTHROMBIN TIME: 10.3 s (ref 9.1–12.0)

## 2016-07-23 LAB — AFP TUMOR MARKER: AFP TUMOR MARKER: 2.5 ng/mL (ref 0.0–8.3)

## 2016-07-26 NOTE — Progress Notes (Signed)
LM for pt to call

## 2016-08-02 NOTE — Progress Notes (Signed)
LM for pt to call. Mailed letter for her to call also.

## 2016-08-10 ENCOUNTER — Telehealth: Payer: Self-pay

## 2016-08-10 NOTE — Telephone Encounter (Signed)
Pt left VM that she received a letter to call about results. I called and LM for her to return call and was told that she should be home after 3:30.  Results are under labs.

## 2016-08-11 NOTE — Telephone Encounter (Signed)
PT called for her results ( under labs ) and was informed. That note said return in 6 months. She has appt on 08/24/2016 with Walden Field, NP. Does she need to cancel that appt and wait 6 months? Please advise!

## 2016-08-12 NOTE — Telephone Encounter (Signed)
Sorry for the confusion, she had her testing done much later than her OV. 34-month follow-up (from the last OV) for routine cirrhosis care would be in December. Keep that appointment.

## 2016-08-13 NOTE — Telephone Encounter (Signed)
Pt is aware she needs to keep appt on 08/24/2016 at 9:30 Am with Walden Field, NP.

## 2016-08-24 ENCOUNTER — Ambulatory Visit: Payer: BC Managed Care – PPO | Admitting: Nurse Practitioner

## 2016-08-26 IMAGING — MG MM DIGITAL SCREENING BILATERAL
5 series · 5 of 5 positions shown · non-contrast
Comparison: Previous exam(s).

CLINICAL DATA: Screening.

EXAM:
DIGITAL SCREENING BILATERAL MAMMOGRAM WITH CAD

[L MLO]
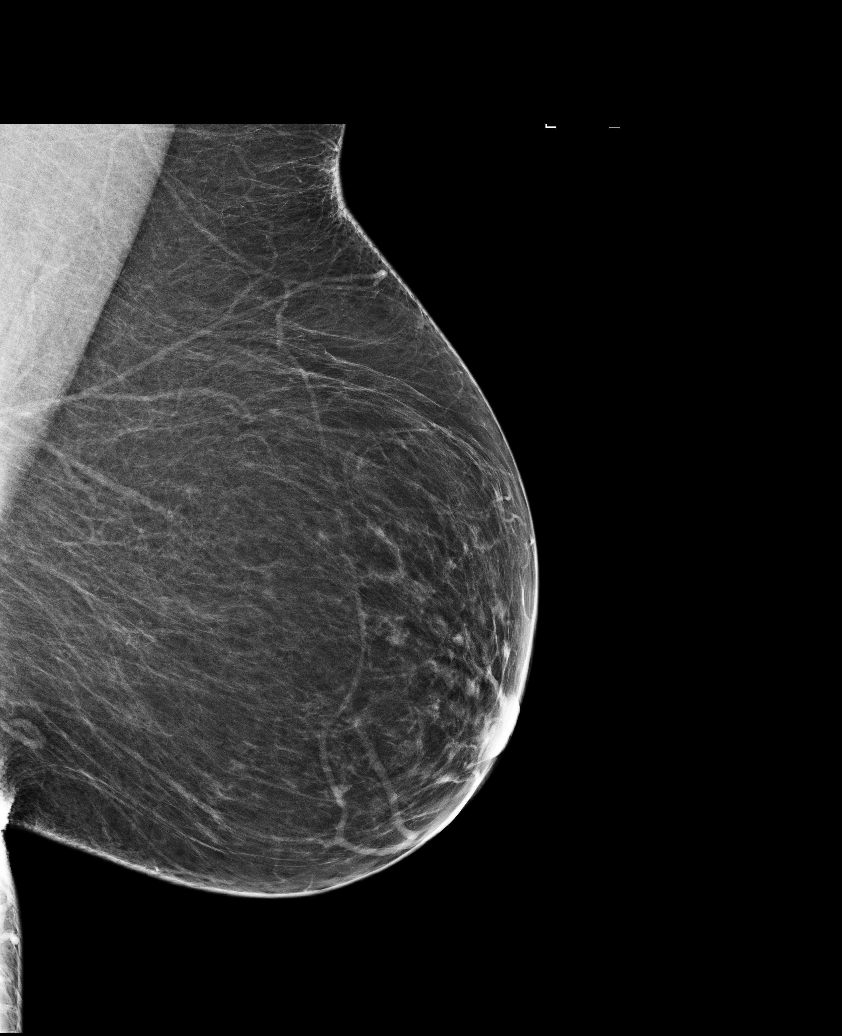

[L CC]
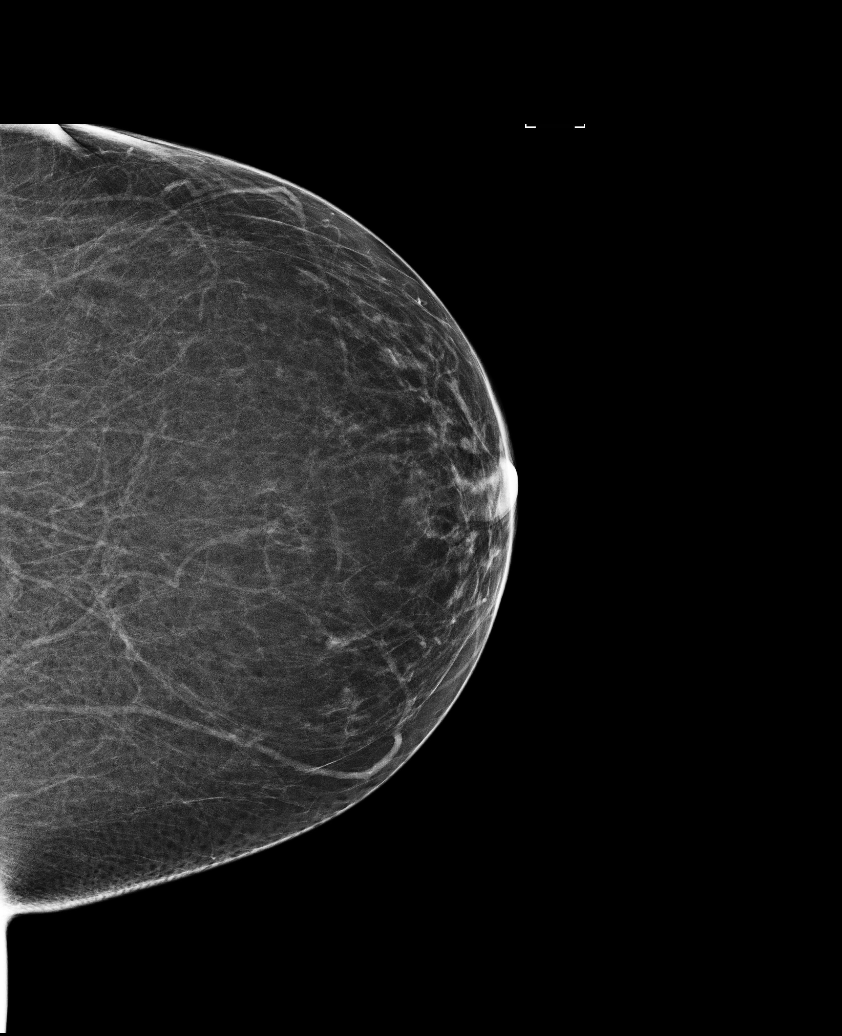

[R XCCL]
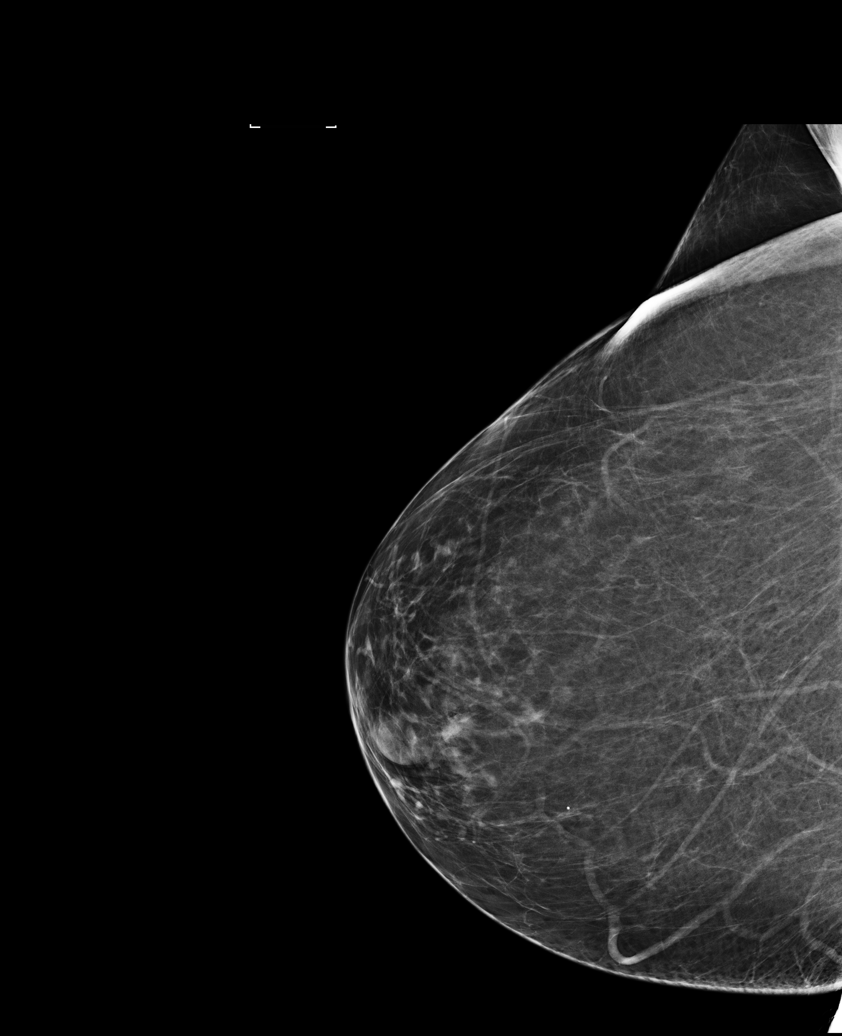

[R MLO]
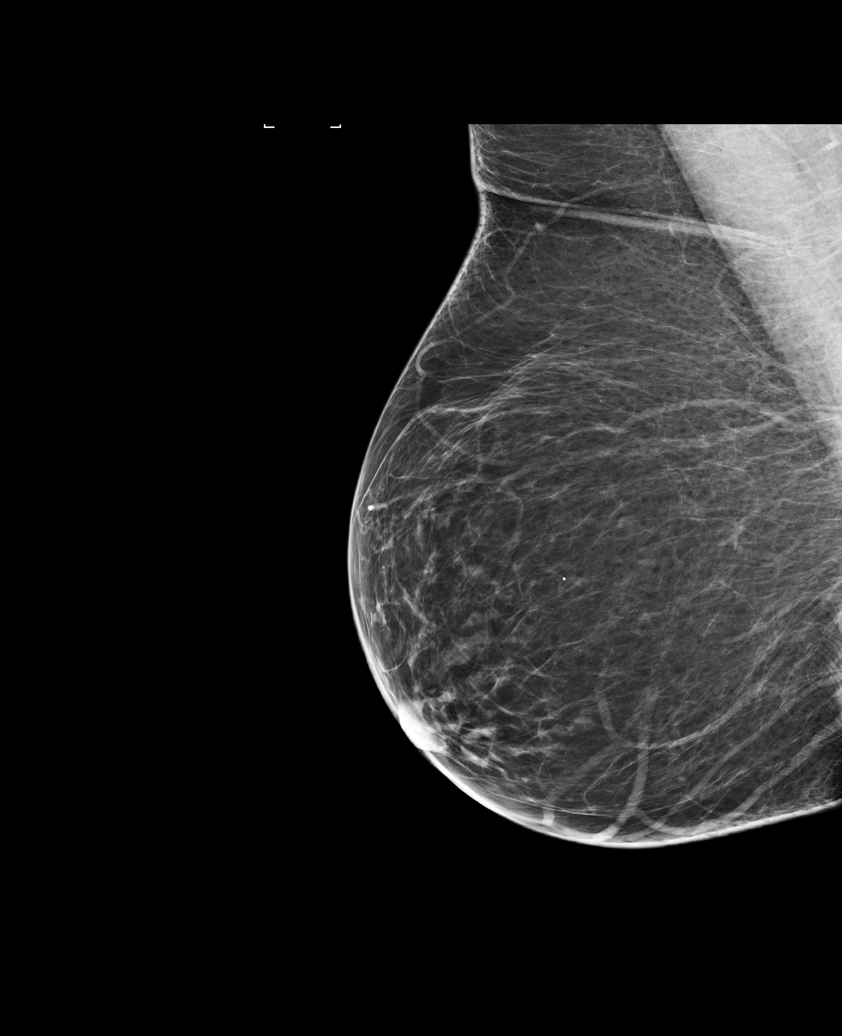

[R CC]
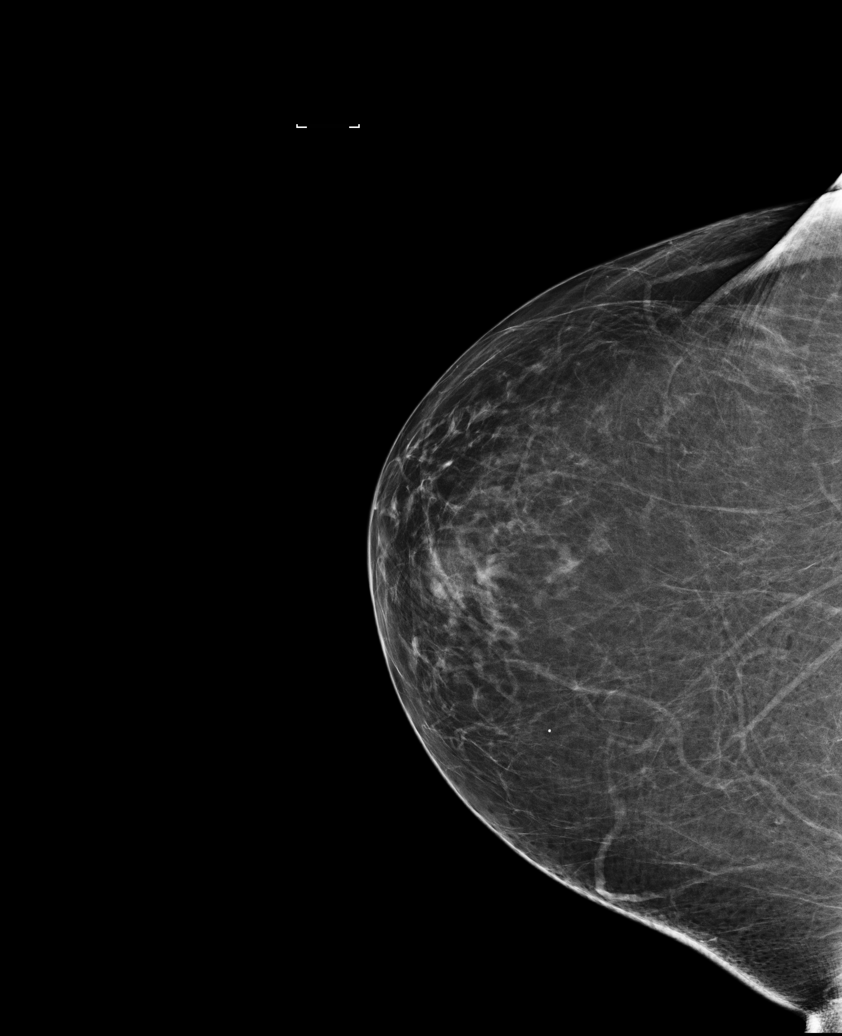

[5 of 5 positions shown; findings below may reference images not displayed]

ACR Breast Density Category b: There are scattered areas of
fibroglandular density.
FINDINGS: There are no findings suspicious for malignancy. Images were
processed with CAD.
IMPRESSION: No mammographic evidence of malignancy. A result letter of this
screening mammogram will be mailed directly to the patient.

RECOMMENDATION:
Screening mammogram in one year. (Code:AS-G-LCT)

BI-RADS CATEGORY  1: Negative.

## 2016-09-21 ENCOUNTER — Ambulatory Visit: Payer: BC Managed Care – PPO | Admitting: Nurse Practitioner

## 2016-10-06 ENCOUNTER — Encounter: Payer: Self-pay | Admitting: Nurse Practitioner

## 2016-10-06 ENCOUNTER — Ambulatory Visit (INDEPENDENT_AMBULATORY_CARE_PROVIDER_SITE_OTHER): Payer: BC Managed Care – PPO | Admitting: Nurse Practitioner

## 2016-10-06 VITALS — BP 130/82 | HR 95 | Temp 97.6°F | Ht 63.0 in | Wt 240.4 lb

## 2016-10-06 DIAGNOSIS — K7460 Unspecified cirrhosis of liver: Secondary | ICD-10-CM

## 2016-10-06 DIAGNOSIS — R161 Splenomegaly, not elsewhere classified: Secondary | ICD-10-CM

## 2016-10-06 NOTE — Progress Notes (Signed)
Referring Provider: Sinda Du, MD Primary Care Physician:  Alonza Bogus, MD Primary GI:  Dr. Oneida Alar  Chief Complaint  Patient presents with  . Cirrhosis    doing ok    HPI:   Chelsea Estrada is a 51 y.o. female who presents for follow-up on cirrhosis. The patient was last seen in our office 02/25/2016 for the same in addition to splenomegaly. At her last visit she was doing well without hepatic symptoms. She did note some loose stools with normal stool studies attributed to stress of her father's recent passing unexpectedly from a CVA. Her A1c is improved down below 7 at that time. Updated labs and imaging were ordered. Her labs were completed 07/22/2016 and found normal CBC including a low-normal platelet count of 155, essentially normal CMP including normal kidney and liver function, normal INR at 1.0, normal AFP. Ultrasound imaging completed 03/02/2016 found liver echogenic with lobular contour consistent with known cirrhosis, no focal hepatic abnormality. Overall she has well compensated disease at that time with a MELD of 6 and Child-Pugh A.  Today she states she's doing well. She has an appointment 2/7 for DM with PCP and will have a1c checked at that time. Denies abdominal pain, N/V, hematochezia, melena, fever, chills, unintentional weight loss, yellowing of skin/eyes, acute episodic confusion, darkened urine, tremors. Has chronic diarrhea since cholecystectomy, worse under significant stress. Denies chest pain, dyspnea, dizziness, lightheadedness, syncope, near syncope. Denies any other upper or lower GI symptoms.  Past Medical History:  Diagnosis Date  . Family history of adverse reaction to anesthesia    mother-- PONV  . Frequency of urination   . History of kidney stones   . Hypertension   . Hypothyroidism   . Lupus    effects joints--  rheumologist-  dr Amil Amen w/ [facility]  . Right ureteral stone   . Type 2 diabetes mellitus ([place])   . Urgency of  urination   . Wears glasses     Past Surgical History:  Procedure Laterality Date  . CHOLECYSTECTOMY OPEN  age 54's  . CYSTOSCOPY WITH RETROGRADE PYELOGRAM, URETEROSCOPY AND STENT PLACEMENT Right 04/04/2015   Procedure: CYSTOSCOPY WITH RETROGRADE PYELOGRAM, URETEROSCOPY AND STENT PLACEMENT;  Surgeon: Alexis Frock, MD;  Location: [facility];  Service: Urology;  Laterality: Right;  . ESOPHAGOGASTRODUODENOSCOPY N/A 08/25/2015   Procedure: ESOPHAGOGASTRODUODENOSCOPY (EGD);  Surgeon: Danie Binder, MD;  [facility];  Service: Endoscopy;  Laterality: N/A;  830  . HOLMIUM LASER APPLICATION Right [id number]   Procedure: HOLMIUM LASER APPLICATION;  Surgeon: Alexis Frock, MD;  Location: [facility];  Service: Urology;  Laterality: Right;    Current Outpatient Prescriptions  Medication Sig Dispense Refill  . acetaminophen (TYLENOL) 325 MG tablet Take 2 tablets (650 mg total) by mouth every 8 (eight) hours as needed for mild pain or moderate pain. NO MORE THAN 6 TABLETS A DAY 500 tablet 0  . aspirin EC 325 MG tablet Take 325 mg by mouth every evening.    . fenofibrate (TRICOR) 145 MG tablet Take 145 mg by mouth daily.    Marland Kitchen glyBURIDE-metformin (GLUCOVANCE) 5-500 MG per tablet Take 2 tablets by mouth 2 (two) times daily with a meal.    . hydroxychloroquine (PLAQUENIL) 200 MG tablet Take 200 mg by mouth 2 (two) times daily.    Marland Kitchen levothyroxine (LEVOXYL) 25 MCG tablet Take 25 mcg by mouth daily before breakfast.    . levothyroxine (SYNTHROID, LEVOTHROID) 25 MCG tablet Take  25 mcg by mouth daily before breakfast.    . Liraglutide (VICTOZA) 18 MG/3ML SOPN Inject 1.8 mg into the skin every morning.    Marland Kitchen lisinopril (PRINIVIL,ZESTRIL) 5 MG tablet Take 5 mg by mouth every morning.    . metoprolol succinate (TOPROL-XL) 50 MG 24 hr tablet Take 50 mg by mouth every morning. Take with or immediately following a meal.    . niacin (NIASPAN) 500 MG CR tablet Take 500 mg by  mouth at bedtime.    . niacin 500 MG tablet Take 500 mg by mouth every morning.    Marland Kitchen omeprazole (PRILOSEC) 20 MG capsule 1 PO 30 mins prior to breakfast and supper (Patient taking differently: daily. 1 PO 30 mins prior to breakfast and supper) 60 capsule 3   No current facility-administered medications for this visit.     Allergies as of 10/06/2016 - Review Complete 10/06/2016  Allergen Reaction Noted  . Penicillins Hives 10/26/2010    Family History  Problem Relation Age of Onset  . Liver disease Mother   . Diabetes type II Mother   . Hypertension Mother   . Diabetes Mellitus II Father   . Hypertension Father   . Heart disease Father   . Colon cancer Neg Hx     Social History   Social History  . Marital status: Married    Spouse name: N/A  . Number of children: N/A  . Years of education: N/A   Social History Main Topics  . Smoking status: Current Every Day Smoker    Packs/day: 1.00    Years: 0.00    Types: Cigarettes  . Smokeless tobacco: Never Used     Comment: one pack daily  . Alcohol use 0.0 oz/week     Comment: Very rarely: typically once every 6+ months; typically 1 drink per occurrence.  . Drug use: No  . Sexual activity: Not Asked   Other Topics Concern  . None   Social History Narrative  . None    Review of Systems: Complete ROS negative except as per HPI.   Physical Exam: BP 130/82   Pulse 95   Temp 97.6 F (36.4 C) (Oral)   Ht 5\' 3"  (1.6 m)   Wt 240 lb 6.4 oz (109 kg)   BMI 42.58 kg/m  General:   Obese female. Alert and oriented. Pleasant and cooperative. Well-nourished and well-developed.  Eyes:  Without icterus, sclera clear and conjunctiva pink.  Ears:  Normal auditory acuity. Cardiovascular:  S1, S2 present without murmurs appreciated. Extremities without clubbing or edema. Respiratory:  Clear to auscultation bilaterally. No wheezes, rales, or rhonchi. No distress.  Gastrointestinal:  +BS, soft, non-tender and non-distended. No HSM  noted. No guarding or rebound. No masses appreciated.  Rectal:  Deferred  Musculoskalatal:  Symmetrical without gross deformities. Neurologic:  Alert and oriented x4;  grossly normal neurologically. Psych:  Alert and cooperative. Normal mood and affect. Heme/Lymph/Immune: No excessive bruising noted.    10/06/2016 9:32 AM   Disclaimer: This note was dictated with voice recognition software. Similar sounding words can inadvertently be transcribed and may not be corrected upon review.

## 2016-10-06 NOTE — Patient Instructions (Signed)
1. Return for follow-up in 6 months. 2. Call with any problems or concerns.

## 2016-10-06 NOTE — Assessment & Plan Note (Signed)
The patient does have splenomegaly on imaging, platelet counts are low normal at 155. At some point she will need an upper endoscopy for variceal screening, however she is declining significant workup with this visit due to health insurance and financial concerns. Return for follow-up in 6 months.

## 2016-10-06 NOTE — Assessment & Plan Note (Signed)
Patient has cirrhosis likely due to fatty liver disease. Her labs and imaging and look great her meld score at last visit was calculated at 6. No focal lesions in the liver. I have recommended every 6 month labs and ultrasound. She is declining updated labs and imaging at this time due to the cost of having them done. She says she will have been done at her next liver visit in 6 months. We will need to also broached the topic of endoscopy for variceal screening at that time. Return for follow-up in 6 months, call with any concerns.

## 2016-10-07 NOTE — Progress Notes (Signed)
cc'ed to pcp °

## 2016-11-03 ENCOUNTER — Other Ambulatory Visit: Payer: Self-pay

## 2016-11-03 MED ORDER — OMEPRAZOLE 20 MG PO CPDR: DELAYED_RELEASE_CAPSULE | ORAL | 5 refills | Status: DC

## 2017-02-22 ENCOUNTER — Encounter: Payer: Self-pay | Admitting: Nurse Practitioner

## 2017-03-28 ENCOUNTER — Ambulatory Visit: Payer: BC Managed Care – PPO | Admitting: Nurse Practitioner

## 2017-04-19 ENCOUNTER — Ambulatory Visit: Payer: BC Managed Care – PPO | Admitting: Nurse Practitioner

## 2017-05-03 ENCOUNTER — Other Ambulatory Visit: Payer: Self-pay

## 2017-05-03 MED ORDER — OMEPRAZOLE 20 MG PO CPDR: DELAYED_RELEASE_CAPSULE | ORAL | 3 refills | Status: DC

## 2017-05-03 NOTE — Telephone Encounter (Signed)
What needs refilling?

## 2017-06-07 ENCOUNTER — Ambulatory Visit: Payer: BC Managed Care – PPO | Admitting: Nurse Practitioner

## 2017-07-18 ENCOUNTER — Encounter: Payer: Self-pay | Admitting: Nurse Practitioner

## 2017-07-18 ENCOUNTER — Telehealth: Payer: Self-pay | Admitting: *Deleted

## 2017-07-18 ENCOUNTER — Ambulatory Visit: Payer: BC Managed Care – PPO | Admitting: Nurse Practitioner

## 2017-07-18 VITALS — BP 112/73 | HR 87 | Temp 97.8°F | Ht 63.0 in | Wt 236.4 lb

## 2017-07-18 DIAGNOSIS — R197 Diarrhea, unspecified: Secondary | ICD-10-CM

## 2017-07-18 DIAGNOSIS — K7460 Unspecified cirrhosis of liver: Secondary | ICD-10-CM

## 2017-07-18 NOTE — Progress Notes (Signed)
cc'ed to pcp °

## 2017-07-18 NOTE — Telephone Encounter (Signed)
Spoke w/ pt and u/s scheduled for 07/25/17 at 11:30am, arrival time 11:15am, NPO 6 hours prior to test. Nothing further needed.

## 2017-07-18 NOTE — Assessment & Plan Note (Signed)
The patient is having worsening of her diarrhea.  This tends to worsen when she is under periods of significant stress.  She is having up to 10 stools a day.  I will check a C. difficile and GI pathogen panel for completeness.  Query possibility of irritable bowel syndrome diarrhea type.  If her stool studies are normal we can consider medication such as Bentyl, Lomotil.  She is not a candidate for Viberzi because she has had her gallbladder removed.  Return for follow-up in 6 months.  Further information and recommendations to be based on labs.

## 2017-07-18 NOTE — Assessment & Plan Note (Signed)
Chronic cirrhosis and due for updated labs.  She is also due for updated ultrasound.  She is agreeable to ultrasound at this time.  She does not like having it done every 6 months as it is difficult for her to pay for.  But she is amendable to once a year.  We will order all these to be drawn in conjunction with her primary care ordered labs.  Return for follow-up in 6 months for routine liver care.
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
## 2017-07-18 NOTE — Patient Instructions (Signed)
1. Have your labs drawn when you are able to. 2. We will help you schedule your ultrasound. 3. Bring your stool studies back to the lab, not our office. 4. Return for follow-up in 6 months. 5. Call us if you have any questions or concerns.

## 2017-07-20 ENCOUNTER — Encounter: Payer: Self-pay | Admitting: Gastroenterology

## 2017-07-21 ENCOUNTER — Other Ambulatory Visit: Payer: Self-pay | Admitting: Nurse Practitioner

## 2017-07-22 ENCOUNTER — Telehealth: Payer: Self-pay | Admitting: *Deleted

## 2017-07-22 LAB — CBC WITH DIFFERENTIAL/PLATELET
BASOS ABS: 0 10*3/uL (ref 0.0–0.2)
Basos: 0 %
EOS (ABSOLUTE): 0.1 10*3/uL (ref 0.0–0.4)
Eos: 1 %
HEMATOCRIT: 36.4 % (ref 34.0–46.6)
HEMOGLOBIN: 12.4 g/dL (ref 11.1–15.9)
Immature Grans (Abs): 0 10*3/uL (ref 0.0–0.1)
Immature Granulocytes: 0 %
LYMPHS ABS: 1.5 10*3/uL (ref 0.7–3.1)
Lymphs: 32 %
MCH: 30.2 pg (ref 26.6–33.0)
MCHC: 34.1 g/dL (ref 31.5–35.7)
MCV: 89 fL (ref 79–97)
MONOCYTES: 10 %
Monocytes Absolute: 0.5 10*3/uL (ref 0.1–0.9)
NEUTROS ABS: 2.7 10*3/uL (ref 1.4–7.0)
Neutrophils: 57 %
Platelets: 121 10*3/uL — ABNORMAL LOW (ref 150–379)
RBC: 4.1 x10E6/uL (ref 3.77–5.28)
RDW: 14.5 % (ref 12.3–15.4)
WBC: 4.8 10*3/uL (ref 3.4–10.8)

## 2017-07-22 LAB — PROTIME-INR
INR: 1 (ref 0.8–1.2)
PROTHROMBIN TIME: 10.4 s (ref 9.1–12.0)

## 2017-07-22 LAB — COMPREHENSIVE METABOLIC PANEL
A/G RATIO: 1.6 (ref 1.2–2.2)
ALBUMIN: 4.3 g/dL (ref 3.5–5.5)
ALK PHOS: 82 IU/L (ref 39–117)
ALT: 16 IU/L (ref 0–32)
AST: 29 IU/L (ref 0–40)
BILIRUBIN TOTAL: 0.4 mg/dL (ref 0.0–1.2)
BUN / CREAT RATIO: 13 (ref 9–23)
BUN: 9 mg/dL (ref 6–24)
CHLORIDE: 102 mmol/L (ref 96–106)
CO2: 21 mmol/L (ref 20–29)
Calcium: 9.3 mg/dL (ref 8.7–10.2)
Creatinine, Ser: 0.67 mg/dL (ref 0.57–1.00)
GFR calc Af Amer: 119 mL/min/{1.73_m2} (ref >59)
GFR calc non Af Amer: 103 mL/min/{1.73_m2} (ref >59)
GLOBULIN, TOTAL: 2.7 g/dL (ref 1.5–4.5)
Glucose: 202 mg/dL — ABNORMAL HIGH (ref 65–99)
POTASSIUM: 4.5 mmol/L (ref 3.5–5.2)
SODIUM: 139 mmol/L (ref 134–144)
Total Protein: 7 g/dL (ref 6.0–8.5)

## 2017-07-22 LAB — AFP TUMOR MARKER: AFP, Serum, Tumor Marker: 2.6 ng/mL (ref 0.0–8.3)

## 2017-07-22 LAB — SPECIMEN STATUS REPORT

## 2017-07-22 NOTE — Telephone Encounter (Signed)
Lab results received from [place] and placed on EG's desk.

## 2017-07-22 NOTE — Telephone Encounter (Signed)
See epic lab results for result note

## 2017-07-25 ENCOUNTER — Ambulatory Visit (HOSPITAL_COMMUNITY)
Admission: RE | Admit: 2017-07-25 | Discharge: 2017-07-25 | Disposition: A | Discharge status: Home / Self Care | Payer: BC Managed Care – PPO | Source: Ambulatory Visit | Attending: Nurse Practitioner | Admitting: Nurse Practitioner

## 2017-07-25 DIAGNOSIS — K7460 Unspecified cirrhosis of liver: Secondary | ICD-10-CM | POA: Insufficient documentation

## 2017-07-25 DIAGNOSIS — R197 Diarrhea, unspecified: Secondary | ICD-10-CM | POA: Diagnosis present

## 2017-07-25 DIAGNOSIS — Z9049 Acquired absence of other specified parts of digestive tract: Secondary | ICD-10-CM | POA: Diagnosis not present

## 2017-07-25 NOTE — Progress Notes (Signed)
Left the message on VM.

## 2017-07-27 NOTE — Progress Notes (Signed)
Pt's husband Shanon Brow is aware.

## 2017-07-27 NOTE — Progress Notes (Signed)
LMOM for a return call.  

## 2017-09-05 ENCOUNTER — Other Ambulatory Visit: Payer: Self-pay | Admitting: *Deleted

## 2017-09-07 MED ORDER — OMEPRAZOLE 20 MG PO CPDR: DELAYED_RELEASE_CAPSULE | ORAL | 3 refills | Status: DC

## 2018-01-04 ENCOUNTER — Encounter: Payer: Self-pay | Admitting: Nurse Practitioner

## 2018-01-16 ENCOUNTER — Ambulatory Visit: Payer: BC Managed Care – PPO | Admitting: Nurse Practitioner

## 2018-01-19 ENCOUNTER — Encounter: Payer: Self-pay | Admitting: Nurse Practitioner

## 2018-01-19 ENCOUNTER — Other Ambulatory Visit: Payer: Self-pay

## 2018-01-19 ENCOUNTER — Ambulatory Visit: Payer: BC Managed Care – PPO | Admitting: Nurse Practitioner

## 2018-01-19 VITALS — BP 126/83 | HR 87 | Temp 97.0°F | Ht 63.0 in | Wt 237.0 lb

## 2018-01-19 DIAGNOSIS — R197 Diarrhea, unspecified: Secondary | ICD-10-CM

## 2018-01-19 DIAGNOSIS — I8500 Esophageal varices without bleeding: Secondary | ICD-10-CM | POA: Insufficient documentation

## 2018-01-19 DIAGNOSIS — K7460 Unspecified cirrhosis of liver: Secondary | ICD-10-CM

## 2018-01-19 DIAGNOSIS — I8510 Secondary esophageal varices without bleeding: Secondary | ICD-10-CM

## 2018-01-19 NOTE — Assessment & Plan Note (Signed)
Diarrhea significantly improved.  Does still have intermittent symptoms.  This is worse when she is under acute stress.  She is overall satisfied with her bowel movements at this time and no further treatment requested.  Continue to monitor, follow-up in 6 months for routine liver care.

## 2018-01-19 NOTE — Assessment & Plan Note (Signed)
Based on labs and imaging generally well compensated disease.  At this point she is currently due for updated labs and imaging.  She declines imaging at this time because of the cost she generally will complete it once a year.  She is amendable to labs.  We will proceed as recommended based on guidelines.  Follow-up in 6 months.

## 2018-01-19 NOTE — Progress Notes (Addendum)
REVIEWED-NO ADDITIONAL RECOMMENDATIONS.  Referring Provider: Sinda Du, MD Primary Care Physician:  Sinda Du, MD Primary GI:  Dr. Oneida Alar  Chief Complaint  Patient presents with  . Cirrhosis    f/u.  Marland Kitchen Diarrhea    has daily but not as bad as before    HPI:   Chelsea Estrada is a 52 y.o. female who presents for follow-up on cirrhosis and diarrhea.  The patient was last seen in our office 07/18/2017 for the same.  History of cirrhosis.  Recent worsening of diarrhea related to stress surrounding her father's recent passing.  At her last visit she was doing well overall.  Some loose stools recently with about 10-15 bowel movements a day and some days only 3-4 bowel movements a day.  Diarrhea previously improved but is worsened again due to an abrupt increase in stress.  Associated with abdominal pain which improves after bowel movement.  Recommended labs and liver imaging, stool studies, follow-up in 6 months.  Labs completed 07/21/2017 including CBC, CMP, INR, AFP.  AFP found to be normal.  Liver disease well compensated with a meld score calculated at 6, child Pugh class A.  Wound of the right upper quadrant/liver completed 07/25/2017 which found status post cholecystectomy, nodular cirrhotic appearing liver without discrete mass.  Does not appear like stool studies were completed.  Today states she's doing better. Diarrhea significantly imrpoved so didn't complete stool studies. Still with diarrhea regularly, worse/flare with stress, but overall improved. Denies abdominal pain, N/V, yellowing of skin/eyes, darkened urine, acute episodic confusion, tremors/shakes, generalized pruritis. Denies chest pain, dyspnea, dizziness, lightheadedness, syncope, near syncope. Denies any other upper or lower GI symptoms.  Last EGD 2016, grade I varices. Recommended 2 year repeat EGD. Currently due.  Past Medical History:  Diagnosis Date  . Family history of adverse reaction to anesthesia     mother-- PONV  . Frequency of urination   . History of kidney stones   . Hypertension   . Hypothyroidism   . Lupus ([place])    effects joints--  rheumologist-  dr Amil Amen w/ [facility]  . Right ureteral stone   . Type 2 diabetes mellitus ([place])   . Urgency of urination   . Wears glasses     Past Surgical History:  Procedure Laterality Date  . CHOLECYSTECTOMY OPEN  age 87's  . CYSTOSCOPY WITH RETROGRADE PYELOGRAM, URETEROSCOPY AND STENT PLACEMENT Right 04/04/2015   Procedure: CYSTOSCOPY WITH RETROGRADE PYELOGRAM, URETEROSCOPY AND STENT PLACEMENT;  Surgeon: Alexis Frock, MD;  Location: [facility];  Service: Urology;  Laterality: Right;  . ESOPHAGOGASTRODUODENOSCOPY N/A 08/25/2015   Procedure: ESOPHAGOGASTRODUODENOSCOPY (EGD);  Surgeon: Danie Binder, MD;  Location[facility];  Service: Endoscopy;  Laterality: N/A;  830  . HOLMIUM LASER APPLICATION Right 5/36/1443   Procedure: HOLMIUM LASER APPLICATION;  Surgeon: Alexis Frock, MD;  Location: [facility];  Service: Urology;  Laterality: Right;    Current Outpatient Medications  Medication Sig Dispense Refill  . acetaminophen (TYLENOL) 325 MG tablet Take 2 tablets (650 mg total) by mouth every 8 (eight) hours as needed for mild pain or moderate pain. NO MORE THAN 6 TABLETS A DAY 500 tablet 0  . aspirin EC 325 MG tablet Take 325 mg by mouth every evening.    Marland Kitchen atorvastatin (LIPITOR) 10 MG tablet Take 1 tablet by mouth daily.    . fenofibrate (TRICOR) 145 MG tablet Take 145 mg by mouth daily.    Marland Kitchen glyBURIDE-metformin (GLUCOVANCE) 5-500  MG per tablet Take 2 tablets by mouth 2 (two) times daily with a meal.    . hydroxychloroquine (PLAQUENIL) 200 MG tablet Take 200 mg daily by mouth.     Marland Kitchen JANUVIA 100 MG tablet Take 1 tablet by mouth daily.    Marland Kitchen levothyroxine (SYNTHROID, LEVOTHROID) 25 MCG tablet Take 25 mcg by mouth daily before breakfast.    . Liraglutide (VICTOZA) 18 MG/3ML SOPN Inject 1.8 mg  into the skin every morning.    Marland Kitchen lisinopril (PRINIVIL,ZESTRIL) 5 MG tablet Take 5 mg by mouth every morning.    . metoprolol succinate (TOPROL-XL) 50 MG 24 hr tablet Take 50 mg by mouth every morning. Take with or immediately following a meal.    . MILK THISTLE PO Take daily by mouth. Pt unsure of dose    . niacin (NIASPAN) 500 MG CR tablet Take 500 mg by mouth at bedtime.    Marland Kitchen omeprazole (PRILOSEC) 20 MG capsule 1 PO 30 mins prior to breakfast and supper 180 capsule 3   No current facility-administered medications for this visit.     Allergies as of 01/19/2018 - Review Complete 01/19/2018  Allergen Reaction Noted  . Penicillins Hives 10/26/2010  . Doxycycline Rash 07/18/2017    Family History  Problem Relation Age of Onset  . Liver disease Mother   . Diabetes type II Mother   . Hypertension Mother   . Diabetes Mellitus II Father   . Hypertension Father   . Heart disease Father   . Colon cancer Neg Hx     Social History   Socioeconomic History  . Marital status: Married    Spouse name: Not on file  . Number of children: Not on file  . Years of education: Not on file  . Highest education level: Not on file  Occupational History  . Not on file  Social Needs  . Financial resource strain: Not on file  . Food insecurity:    Worry: Not on file    Inability: Not on file  . Transportation needs:    Medical: Not on file    Non-medical: Not on file  Tobacco Use  . Smoking status: Current Every Day Smoker    Packs/day: 1.00    Years: 0.00    Pack years: 0.00    Types: Cigarettes  . Smokeless tobacco: Never Used  . Tobacco comment: one pack daily  Substance and Sexual Activity  . Alcohol use: Yes    Alcohol/week: 0.0 oz    Comment: Very rarely: typically once every 6+ months; typically 1 drink per occurrence.  . Drug use: No  . Sexual activity: Not on file  Lifestyle  . Physical activity:    Days per week: Not on file    Minutes per session: Not on file  . Stress:  Not on file  Relationships  . Social connections:    Talks on phone: Not on file    Gets together: Not on file    Attends religious service: Not on file    Active member of club or organization: Not on file    Attends meetings of clubs or organizations: Not on file    Relationship status: Not on file  Other Topics Concern  . Not on file  Social History Narrative  . Not on file    Review of Systems: Complete ROS negative except as per HPI.   Physical Exam: BP 126/83   Pulse 87   Temp (!) 97 F (36.1 C) (Oral)  Ht 5\' 3"  (1.6 m)   Wt 237 lb (107.5 kg)   BMI 41.98 kg/m  General:   Alert and oriented. Pleasant and cooperative. Well-nourished and well-developed.  Eyes:  Without icterus, sclera clear and conjunctiva pink.  Ears:  Normal auditory acuity. Cardiovascular:  S1, S2 present without murmurs appreciated. Extremities without clubbing or edema. Respiratory:  Clear to auscultation bilaterally. No wheezes, rales, or rhonchi. No distress.  Gastrointestinal:  +BS, soft, non-tender and non-distended. No HSM noted. No guarding or rebound. No masses appreciated.  Rectal:  Deferred  Musculoskalatal:  Symmetrical without gross deformities. Neurologic:  Alert and oriented x4;  grossly normal neurologically. Psych:  Alert and cooperative. Normal mood and affect. Heme/Lymph/Immune: No excessive bruising noted.    01/19/2018 2:28 PM   Disclaimer: This note was dictated with voice recognition software. Similar sounding words can inadvertently be transcribed and may not be corrected upon review.

## 2018-01-19 NOTE — Assessment & Plan Note (Signed)
History of grade 1 esophageal varices on last EGD in 2016.  Recommended to your repeat exam, can she is currently overdue.  She is agreeable to scheduling at this time.  We will proceed with EGD for variceal surveillance.  Return for follow-up in 6 months.  Proceed with EGD with Dr. Oneida Alar in near future: the risks, benefits, and alternatives have been discussed with the patient in detail. The patient states understanding and desires to proceed.  The patient is not on any anticoagulants, anxiolytics, chronic pain medications, or antidepressants.  Conscious sedation should be adequate for procedure as it was for her last.

## 2018-01-19 NOTE — Patient Instructions (Signed)
1. Have your labs drawn when you are able to. 2. We will schedule your upper endoscopy for you. 3. Return for follow-up in 6 months. 4. Call us if you have any questions or concerns  At [facility] we value your feedback. You may receive a survey about your visit today. Please share your experience as we strive to create trusting relationships with our patients to provide genuine, compassionate, quality care.  It was great to see you today!  I hope you have a wonderful summer!!

## 2018-01-20 NOTE — Progress Notes (Signed)
cc'ed to pcp °

## 2018-02-13 ENCOUNTER — Telehealth: Payer: Self-pay

## 2018-02-13 NOTE — Telephone Encounter (Signed)
Called pt, urgent case added to [facility]'s schedule 02/1318. Pt unable to do EGD later in the day and requested to reschedule. EGD changed to 02/17/18 at 8:30am, arrive at 7:30am. Stated 02/17/18 worked better for her anyway. New instructions given verbally, verbalized understanding. Called and informed Endo scheduler.

## 2018-02-17 ENCOUNTER — Other Ambulatory Visit: Payer: Self-pay

## 2018-02-17 ENCOUNTER — Telehealth: Payer: Self-pay | Admitting: Gastroenterology

## 2018-02-17 ENCOUNTER — Encounter (HOSPITAL_COMMUNITY): Payer: Self-pay | Admitting: *Deleted

## 2018-02-17 ENCOUNTER — Encounter (HOSPITAL_COMMUNITY)
Admission: RE | Disposition: A | Discharge status: Home / Self Care | Payer: Self-pay | Source: Ambulatory Visit | Attending: Gastroenterology

## 2018-02-17 ENCOUNTER — Ambulatory Visit (HOSPITAL_COMMUNITY)
Admission: RE | Admit: 2018-02-17 | Discharge: 2018-02-17 | Disposition: A | Discharge status: Home / Self Care | Payer: BC Managed Care – PPO | Source: Ambulatory Visit | Attending: Gastroenterology | Admitting: Gastroenterology

## 2018-02-17 DIAGNOSIS — K5710 Diverticulosis of small intestine without perforation or abscess without bleeding: Secondary | ICD-10-CM | POA: Diagnosis not present

## 2018-02-17 DIAGNOSIS — K222 Esophageal obstruction: Secondary | ICD-10-CM | POA: Insufficient documentation

## 2018-02-17 DIAGNOSIS — I8500 Esophageal varices without bleeding: Secondary | ICD-10-CM | POA: Diagnosis not present

## 2018-02-17 DIAGNOSIS — E119 Type 2 diabetes mellitus without complications: Secondary | ICD-10-CM | POA: Diagnosis not present

## 2018-02-17 DIAGNOSIS — M329 Systemic lupus erythematosus, unspecified: Secondary | ICD-10-CM | POA: Insufficient documentation

## 2018-02-17 DIAGNOSIS — E039 Hypothyroidism, unspecified: Secondary | ICD-10-CM | POA: Insufficient documentation

## 2018-02-17 DIAGNOSIS — R197 Diarrhea, unspecified: Secondary | ICD-10-CM | POA: Diagnosis not present

## 2018-02-17 DIAGNOSIS — K2960 Other gastritis without bleeding: Secondary | ICD-10-CM | POA: Diagnosis not present

## 2018-02-17 DIAGNOSIS — Z87442 Personal history of urinary calculi: Secondary | ICD-10-CM | POA: Diagnosis not present

## 2018-02-17 DIAGNOSIS — Z7984 Long term (current) use of oral hypoglycemic drugs: Secondary | ICD-10-CM | POA: Insufficient documentation

## 2018-02-17 DIAGNOSIS — K2970 Gastritis, unspecified, without bleeding: Secondary | ICD-10-CM

## 2018-02-17 DIAGNOSIS — I10 Essential (primary) hypertension: Secondary | ICD-10-CM | POA: Diagnosis not present

## 2018-02-17 DIAGNOSIS — Z79899 Other long term (current) drug therapy: Secondary | ICD-10-CM | POA: Insufficient documentation

## 2018-02-17 DIAGNOSIS — Z7982 Long term (current) use of aspirin: Secondary | ICD-10-CM | POA: Insufficient documentation

## 2018-02-17 DIAGNOSIS — K2980 Duodenitis without bleeding: Secondary | ICD-10-CM | POA: Diagnosis not present

## 2018-02-17 DIAGNOSIS — Z800 Family history of malignant neoplasm of digestive organs: Secondary | ICD-10-CM | POA: Insufficient documentation

## 2018-02-17 DIAGNOSIS — F17210 Nicotine dependence, cigarettes, uncomplicated: Secondary | ICD-10-CM | POA: Insufficient documentation

## 2018-02-17 DIAGNOSIS — I8510 Secondary esophageal varices without bleeding: Secondary | ICD-10-CM

## 2018-02-17 HISTORY — PX: ESOPHAGOGASTRODUODENOSCOPY: SHX5428

## 2018-02-17 HISTORY — PX: BIOPSY: SHX5522

## 2018-02-17 LAB — CBC
HCT: 33 % — ABNORMAL LOW (ref 36.0–46.0)
Hemoglobin: 10.4 g/dL — ABNORMAL LOW (ref 12.0–15.0)
MCH: 29.1 pg (ref 26.0–34.0)
MCHC: 31.5 g/dL (ref 30.0–36.0)
MCV: 92.2 fL (ref 78.0–100.0)
PLATELETS: 90 10*3/uL — AB (ref 150–400)
RBC: 3.58 MIL/uL — ABNORMAL LOW (ref 3.87–5.11)
RDW: 13.7 % (ref 11.5–15.5)
WBC: 3.8 10*3/uL — AB (ref 4.0–10.5)

## 2018-02-17 LAB — GLUCOSE, CAPILLARY: GLUCOSE-CAPILLARY: 205 mg/dL — AB (ref 65–99)

## 2018-02-17 SURGERY — EGD (ESOPHAGOGASTRODUODENOSCOPY)
Anesthesia: Moderate Sedation

## 2018-02-17 MED ORDER — LIDOCAINE VISCOUS HCL 2 % MT SOLN
OROMUCOSAL | Status: AC
  Filled 2018-02-17: qty 15

## 2018-02-17 MED ORDER — MEPERIDINE HCL 100 MG/ML IJ SOLN
INTRAMUSCULAR | Status: DC | PRN
  Administered 2018-02-17: 25 mg via INTRAVENOUS
  Administered 2018-02-17: 50 mg via INTRAVENOUS
  Administered 2018-02-17: 25 mg via INTRAVENOUS

## 2018-02-17 MED ORDER — MEPERIDINE HCL 100 MG/ML IJ SOLN
INTRAMUSCULAR | Status: AC
  Filled 2018-02-17: qty 2

## 2018-02-17 MED ORDER — LIDOCAINE VISCOUS HCL 2 % MT SOLN
OROMUCOSAL | Status: DC | PRN
  Administered 2018-02-17: 1 via OROMUCOSAL

## 2018-02-17 MED ORDER — MIDAZOLAM HCL 5 MG/5ML IJ SOLN
INTRAMUSCULAR | Status: AC
  Filled 2018-02-17: qty 10

## 2018-02-17 MED ORDER — SODIUM CHLORIDE 0.9 % IV SOLN
INTRAVENOUS | Status: DC
  Administered 2018-02-17: 08:00:00 via INTRAVENOUS

## 2018-02-17 MED ORDER — STERILE WATER FOR IRRIGATION IR SOLN
Status: DC | PRN
  Administered 2018-02-17: 2.5 mL

## 2018-02-17 MED ORDER — MIDAZOLAM HCL 5 MG/5ML IJ SOLN
INTRAMUSCULAR | Status: DC | PRN
  Administered 2018-02-17: 2 mg via INTRAVENOUS
  Administered 2018-02-17: 1 mg via INTRAVENOUS
  Administered 2018-02-17: 2 mg via INTRAVENOUS
  Administered 2018-02-17: 1 mg via INTRAVENOUS

## 2018-02-17 NOTE — Op Note (Signed)
[facility] Patient Name: Chelsea Estrada Procedure Date: 02/17/2018 8:39 AM MRN: [id number] Date of Birth: 02-26-1966 Attending MD: Barney Drain MD, MD CSN: [id number] Age: 52 Admit Type: Outpatient Procedure:                Upper GI endoscopy WITH COLD FORCEPS BIOPSY Indications:              Follow-up of esophageal varices, Diarrhea. CBC DEC                            2018 PLT CT 121K. Providers:                Barney Drain MD, MD, Lurline Del, RN, Nelma Rothman,                            Technician Referring MD:             Jasper Loser. Luan Pulling MD, MD Medicines:                Meperidine 100 mg IV, Midazolam 6 mg IV Complications:            No immediate complications. Estimated Blood Loss:     Estimated blood loss was minimal. Procedure:                Pre-Anesthesia Assessment:                           - Prior to the procedure, a History and Physical                            was performed, and patient medications and                            allergies were reviewed. The patient's tolerance of                            previous anesthesia was also reviewed. The risks                            and benefits of the procedure and the sedation                            options and risks were discussed with the patient.                            All questions were answered, and informed consent                            was obtained. Prior Anticoagulants: The patient has                            taken aspirin, last dose was 1 day prior to                            procedure. ASA Grade Assessment: II - A patient  with mild systemic disease. After reviewing the                            risks and benefits, the patient was deemed in                            satisfactory condition to undergo the procedure.                            After obtaining informed consent, the endoscope was                            passed under direct vision. Throughout  the                            procedure, the patient's blood pressure, pulse, and                            oxygen saturations were monitored continuously. The                            [id number] ([id number]) scope was introduced through the                            mouth, and advanced to the second part of duodenum.                            The upper GI endoscopy was accomplished without                            difficulty. The patient tolerated the procedure                            well. Scope In: 8:59:08 AM Scope Out: 9:06:46 AM Total Procedure Duration: 0 hours 7 minutes 38 seconds  Findings:      A non-obstructing Schatzki ring was found at the gastroesophageal       junction.      The exam was otherwise without abnormality.      Localized mild inflammation characterized by congestion (edema),       erosions and erythema was found on the greater curvature of the stomach       and in the gastric antrum.      A small non-bleeding diverticulum was found in the second portion of the       duodenum.      The duodenal bulb was normal. Biopsies for histology were taken with a       cold forceps for evaluation of celiac disease. Impression:               - Non-obstructing Schatzki ring.                           - EROSIVE Gastritis DUE TO ASA USE.                           -  Non-bleeding duodenal diverticulum. Moderate Sedation:      Moderate (conscious) sedation was administered by the endoscopy nurse       and supervised by the endoscopist. Total physician intraservice time was       23 minutes. Recommendation:           - Await pathology results. CBC TODAY. COMPLETE RUQ                            IN JUN 2019.                           - Low fat diet. HOLD ASA UNLESS MEDICALLY NECESSARY.                           - Continue present medications. MAY NEED ABX FOR                            SIBO.                           - Return to my office in 6 months.                            - Repeat upper endoscopy in 3 years for                            surveillance OF VARICES.                           - Patient has a contact number available for                            emergencies. The signs and symptoms of potential                            delayed complications were discussed with the                            patient. Return to normal activities tomorrow.                            Written discharge instructions were provided to the                            patient. Procedure Code(s):        --- Professional ---                           [phone], Esophagogastroduodenoscopy, flexible,                            transoral; with biopsy, single or multiple                           G0500, Moderate sedation services provided by the  same physician or other qualified health care                            professional performing a gastrointestinal                            endoscopic service that sedation supports,                            requiring the presence of an independent trained                            observer to assist in the monitoring of the                            patient's level of consciousness and physiological                            status; initial 15 minutes of intra-service time;                            patient age 51 years or older (additional time may                            be reported with [phone], as appropriate)                           [phone], Moderate sedation services provided by the                            same physician or other qualified health care                            professional performing the diagnostic or                            therapeutic service that the sedation supports,                            requiring the presence of an independent trained                            observer to assist in the monitoring of the                            patient's level of  consciousness and physiological                            status; each additional 15 minutes intraservice                            time (List separately in addition to code for  primary service) Diagnosis Code(s):        --- Professional ---                           K22.2, Esophageal obstruction                           K29.70, Gastritis, unspecified, without bleeding                           I85.00, Esophageal varices without bleeding                           R19.7, Diarrhea, unspecified                           K57.10, Diverticulosis of small intestine without                            perforation or abscess without bleeding CPT copyright 2017 [employer] Association. All rights reserved. The codes documented in this report are preliminary and upon coder review may  be revised to meet current compliance requirements. Barney Drain, MD Barney Drain MD, MD 02/17/2018 9:23:21 AM This report has been signed electronically. Number of Addenda: 0

## 2018-02-17 NOTE — Telephone Encounter (Addendum)
TRIAGE FOR COLONOSCOPY WITHIN THE NEXT 1-2 MOS. WE WILL GIVE YOU PHENERGAN 12.5 MG IV IN PREOP. NEEDS SUPREP OR PLENVU SAMPLE.

## 2018-02-17 NOTE — Telephone Encounter (Signed)
Chelsea Estrada, please schedule pt for nurse visit.   Claudina Lick, LPN  Hassan Rowan, LPN        yes   Previous Messages    ----- Message -----  From: Hassan Rowan, LPN  Sent: 03/08/5008  9:43 AM  To: Claudina Lick, LPN  Subject: triage                      SLF wants to triage for TCS. Will she need a nurse visit?

## 2018-02-17 NOTE — H&P (Signed)
Primary Care Physician:  Sinda Du, MD Primary Gastroenterologist:  Dr. Oneida Alar  Pre-Procedure History & Physical: HPI:  Chelsea Estrada is a 52 y.o. 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  Past Medical History:  Diagnosis Date  . Family history of adverse reaction to anesthesia    mother-- PONV  . Frequency of urination   . History of kidney stones   . Hypertension   . Hypothyroidism   . Lupus ([place])    effects joints--  rheumologist-  dr Amil Amen w/ [facility]  . Right ureteral stone   . Type 2 diabetes mellitus ([place])   . Urgency of urination   . Wears glasses     Past Surgical History:  Procedure Laterality Date  . CHOLECYSTECTOMY OPEN  age 47's  . CYSTOSCOPY WITH RETROGRADE PYELOGRAM, URETEROSCOPY AND STENT PLACEMENT Right 04/04/2015   Procedure: CYSTOSCOPY WITH RETROGRADE PYELOGRAM, URETEROSCOPY AND STENT PLACEMENT;  Surgeon: Alexis Frock, MD;  Location: [facility];  Service: Urology;  Laterality: Right;  . ESOPHAGOGASTRODUODENOSCOPY N/A 08/25/2015   Procedure: ESOPHAGOGASTRODUODENOSCOPY (EGD);  Surgeon: Danie Binder, MD;  [facility];  Service: Endoscopy;  Laterality: N/A;  830  . HOLMIUM LASER APPLICATION Right 5/99/3570   Procedure: HOLMIUM LASER APPLICATION;  Surgeon: Alexis Frock, MD;  Location: [facility];  Service: Urology;  Laterality: Right;    Prior to Admission medications   Medication Sig Start Date End Date Taking? Authorizing Provider  aspirin EC 325 MG tablet Take 325 mg by mouth every evening.   Yes [provider]  atorvastatin (LIPITOR) 10 MG tablet Take 10 mg by mouth daily.  12/19/17  Yes [provider]  fenofibrate 160 MG tablet Take 145 mg by mouth at bedtime.    Yes [provider]  glyBURIDE-metformin (GLUCOVANCE) 5-500 MG per tablet Take 2 tablets by mouth 2 (two) times daily with a meal.   Yes [provider]  hydroxychloroquine (PLAQUENIL) 200 MG  tablet Take 200 mg daily by mouth.    Yes [provider]  JANUVIA 100 MG tablet Take 100 mg by mouth daily.  01/16/18  Yes [provider]  Javier Docker Oil 500 MG CAPS Take 500 mg by mouth daily.   Yes [provider]  levothyroxine (SYNTHROID, LEVOTHROID) 25 MCG tablet Take 25 mcg by mouth daily before breakfast.   Yes [provider]  Liraglutide (VICTOZA) 18 MG/3ML SOPN Inject 1.8 mg into the skin every morning.   Yes [provider]  lisinopril (PRINIVIL,ZESTRIL) 5 MG tablet Take 5 mg by mouth every morning.   Yes [provider]  magnesium oxide (MAG-OX) 400 MG tablet Take 400 mg by mouth daily.   Yes [provider]  metoprolol succinate (TOPROL-XL) 50 MG 24 hr tablet Take 50 mg by mouth every morning. Take with or immediately following a meal.   Yes [provider]  Milk Thistle 1000 MG CAPS Take 1,000 mg by mouth daily.    Yes [provider]  niacin (NIASPAN) 500 MG CR tablet Take 500 mg by mouth at bedtime.   Yes [provider]  niacin 500 MG tablet Take 500 mg by mouth every morning.   Yes [provider]  omeprazole (PRILOSEC) 20 MG capsule 1 PO 30 mins prior to breakfast and supper Patient taking differently: Take 20 mg by mouth daily. 1 PO 30 mins prior to breakfast and supper 09/07/17  Yes Annitta Needs, NP  acetaminophen (TYLENOL) 325 MG tablet Take  2 tablets (650 mg total) by mouth every 8 (eight) hours as needed for mild pain or moderate pain. NO MORE THAN 6 TABLETS A DAY 08/25/15   Danie Binder, MD    Allergies as of 01/19/2018 - Review Complete 01/19/2018  Allergen Reaction Noted  . Penicillins Hives 10/26/2010  . Doxycycline Rash 07/18/2017    Family History  Problem Relation Age of Onset  . Liver disease Mother   . Diabetes type II Mother   . Hypertension Mother   . Diabetes Mellitus II Father   . Hypertension Father   . Heart disease Father   . Colon cancer Neg Hx      Social History   Socioeconomic History  . Marital status: Married    Spouse name: Not on file  . Number of children: Not on file  . Years of education: Not on file  . Highest education level: Not on file  Occupational History  . Not on file  Social Needs  . Financial resource strain: Not on file  . Food insecurity:    Worry: Not on file    Inability: Not on file  . Transportation needs:    Medical: Not on file    Non-medical: Not on file  Tobacco Use  . Smoking status: Current Every Day Smoker    Packs/day: 1.00    Years: 0.00    Pack years: 0.00    Types: Cigarettes  . Smokeless tobacco: Never Used  . Tobacco comment: one pack daily  Substance and Sexual Activity  . Alcohol use: Yes    Alcohol/week: 0.0 oz    Comment: Very rarely: typically once every 6+ months; typically 1 drink per occurrence.  . Drug use: No  . Sexual activity: Not on file  Lifestyle  . Physical activity:    Days per week: Not on file    Minutes per session: Not on file  . Stress: Not on file  Relationships  . Social connections:    Talks on phone: Not on file    Gets together: Not on file    Attends religious service: Not on file    Active member of club or organization: Not on file    Attends meetings of clubs or organizations: Not on file    Relationship status: Not on file  . Intimate partner violence:    Fear of current or ex partner: Not on file    Emotionally abused: Not on file    Physically abused: Not on file    Forced sexual activity: Not on file  Other Topics Concern  . Not on file  Social History Narrative  . Not on file    Review of Systems: See HPI, otherwise negative ROS   Physical Exam: BP 125/78   Pulse 84   Temp 98 F (36.7 C) (Oral)   Resp (!) 21   SpO2 98%  General:   Alert,  pleasant and cooperative in NAD Head:  Normocephalic and atraumatic. Neck:  Supple; Lungs:  Clear throughout to auscultation.    Heart:  Regular rate and rhythm. Abdomen:   Soft, nontender and nondistended. Normal bowel sounds, without guarding, and without rebound.   Neurologic:  Alert and  oriented x4;  grossly normal neurologically.  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

## 2018-02-17 NOTE — Discharge Instructions (Signed)
You have EROSIVE gastritis DUE TO ASPIRIN AND RING AT THE BASE OF YOUR ESOPHAGUS DUE TO REFLUX. I biopsied your SMALL BOWEL.    DRINK WATER TO KEEP YOUR URINE LIGHT YELLOW.  CONTINUE YOUR WEIGHT LOSS EFFORTS.  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 THIS CAN SHORTEN YOUR LIFE EXPECTANCY 10 YEARS. AS WELL OBESITY ACTIVATES CANCER GENES.  OBESITY IS ASSOCIATED WITH AN INCREASED RISK FOR worsening cirrhosis and ALL CANCERS, INCLUDING ESOPHAGEAL AND COLON CANCER.   PLEASE LET ME KNOW IF YOU WOULD LIKE A REFERRAL FOR WEIGHT LOSS SURGERY.  STRICTLY FOLLOW A LOW FAT DIET. MEATS SHOULD BE BAKED, BROILED, OR BOILED. AVOID FRIED FOODS. SEE INFO BELOW.   HOLD ASPIRIN UNLESS IT'S MEDICALLY NECESSARY.   CONTINUE OMEPRAZOLE 30 MINUTES PRIOR TO MEALS TWICE DAILY.   I WILL CHECK YOU CBC TODAY. YOU SHOULD COMPLETE YOUR ULTRASOUND IN JUN 2019.  TRIAGE FOR COLONOSCOPY WITHIN THE NEXT 1-2 MOS.   Return to my office in 6 months.   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   UPPER ENDOSCOPY AFTER CARE Read the instructions outlined below and refer to this sheet in the next week. These discharge instructions provide you with general information on caring for yourself after you leave the hospital. While your treatment has been planned according to the most current medical practices available, unavoidable complications occasionally occur. If you have any problems or questions after discharge, call DR. Jurnei Latini, [phone].  ACTIVITY  You may resume your regular activity, but move at a slower pace for the next 24 hours.   Take frequent rest periods for the next 24 hours.   Walking will help get rid of the air and reduce the bloated feeling in your belly (abdomen).   No driving for 24 hours (because of the medicine (anesthesia) used during the test).   You may shower.   Do not sign any important legal documents or  operate any machinery for 24 hours (because of the anesthesia used during the test).    NUTRITION  Drink plenty of fluids.   You may resume your normal diet as instructed by your doctor.   Begin with a light meal and progress to your normal diet. Heavy or fried foods are harder to digest and may make you feel sick to your stomach (nauseated).   Avoid alcoholic beverages for 24 hours or as instructed.    MEDICATIONS  You may resume your normal medications.   WHAT YOU CAN EXPECT TODAY  Some feelings of bloating in the abdomen.   Passage of more gas than usual.    IF YOU HAD A BIOPSY TAKEN DURING THE UPPER ENDOSCOPY:  Eat a soft diet IF YOU HAVE NAUSEA, BLOATING, ABDOMINAL PAIN, OR VOMITING.    FINDING OUT THE RESULTS OF YOUR TEST Not all test results are available during your visit. DR. Oneida Alar WILL CALL YOU WITHIN 14 DAYS OF YOUR PROCEDUE WITH YOUR RESULTS. Do not assume everything is normal if you have not heard from DR. Quinetta Shilling, CALL HER OFFICE AT [phone].  SEEK IMMEDIATE MEDICAL ATTENTION AND CALL THE OFFICE: [phone] IF:  You have more than a spotting of blood in your stool.   Your belly is swollen (abdominal distention).   You are nauseated or vomiting.   You have a temperature over 101F.   You have abdominal pain or discomfort that is severe or gets worse throughout the day.  Gastritis  Gastritis is an inflammation (the body's way of reacting to injury and/or infection) of the stomach. It is often caused by viral or bacterial (germ) infections. It can also be caused BY ASPIRIN, BC/GOODY POWDER'S, (IBUPROFEN) MOTRIN, OR ALEVE (NAPROXEN), chemicals (including alcohol), SPICY FOODS, and medications. This illness may be associated with generalized malaise (feeling tired, not well), UPPER ABDOMINAL STOMACH cramps, and fever. One common bacterial cause of gastritis is an organism known as H. Pylori. This can be treated with antibiotics.    Low-Fat  Diet  BREADS, CEREALS, PASTA, RICE, DRIED PEAS, AND BEANS These products are high in carbohydrates and most are low in fat. Therefore, they can be increased in the diet as substitutes for fatty foods. They too, however, contain calories and should not be eaten in excess. Cereals can be eaten for snacks as well as for breakfast.  Include foods that contain fiber (fruits, vegetables, whole grains, and legumes). Research shows that fiber may lower blood cholesterol levels, especially the water-soluble fiber found in fruits, vegetables, oat products, and legumes.  FRUITS AND VEGETABLES It is good to eat fruits and vegetables. Besides being sources of fiber, both are rich in vitamins and some minerals. They help you get the daily allowances of these nutrients. Fruits and vegetables can be used for snacks and desserts.  MEATS Limit lean meat, chicken, [place], and fish to no more than 6 ounces per day.  Beef, Pork, and Lamb Use lean cuts of beef, pork, and lamb. Lean cuts include:  Extra-lean ground beef.  Arm roast.  Sirloin tip.  Center-cut ham.  Round steak.  Loin chops.  Rump roast.  Tenderloin.  Trim all fat off the outside of meats before cooking. It is not necessary to severely decrease the intake of red meat, but lean choices should be made. Lean meat is rich in protein and contains a highly absorbable form of iron. Premenopausal women, in particular, should avoid reducing lean red meat because this could increase the risk for low red blood cells (iron-deficiency anemia).  Chicken and [place] These are good sources of protein. The fat of poultry can be reduced by removing the skin and underlying fat layers before cooking. Chicken and [place] can be substituted for lean red meat in the diet. Poultry should not be fried or covered with high-fat sauces.  Fish and Shellfish Fish is a good source of protein. Shellfish contain cholesterol, but they usually are low in saturated fatty acids. The  preparation of fish is important. Like chicken and [place], they should not be fried or covered with high-fat sauces.  EGGS Egg whites contain no fat or cholesterol. They can be eaten often. Try 1 to 2 egg whites instead of whole eggs in recipes or use egg substitutes that do not contain yolk.  MILK AND DAIRY PRODUCTS Use skim or 1% milk instead of 2% or whole milk. Decrease whole milk, natural, and processed cheeses. Use nonfat or low-fat (2%) cottage cheese or low-fat cheeses made from vegetable oils. Choose nonfat or low-fat (1 to 2%) yogurt. Experiment with evaporated skim milk in recipes that call for heavy cream. Substitute low-fat yogurt or low-fat cottage cheese for sour cream in dips and salad dressings. Have at least 2 servings of low-fat dairy products, such as 2 glasses of skim (or 1%) milk each day to help get your daily calcium intake.  FATS AND OILS Reduce the total intake of fats, especially saturated fat. Butterfat, lard, and beef fats are high in saturated  fat and cholesterol. These should be avoided as much as possible. Vegetable fats do not contain cholesterol, but certain vegetable fats, such as coconut oil, palm oil, and palm kernel oil are very high in saturated fats. These should be limited. These fats are often used in bakery goods, processed foods, popcorn, oils, and nondairy creamers. Vegetable shortenings and some peanut butters contain hydrogenated oils, which are also saturated fats. Read the labels on these foods and check for saturated vegetable oils.  Unsaturated vegetable oils and fats do not raise blood cholesterol. However, they should be limited because they are fats and are high in calories. Total fat should still be limited to 30% of your daily caloric intake. Desirable liquid vegetable oils are corn oil, cottonseed oil, olive oil, canola oil, safflower oil, soybean oil, and sunflower oil. Peanut oil is not as good, but small amounts are acceptable. Buy a  heart-healthy tub margarine that has no partially hydrogenated oils in the ingredients. Mayonnaise and salad dressings often are made from unsaturated fats, but they should also be limited because of their high calorie and fat content. Seeds, nuts, peanut butter, olives, and avocados are high in fat, but the fat is mainly the unsaturated type. These foods should be limited mainly to avoid excess calories and fat.  OTHER EATING TIPS Snacks  Most sweets should be limited as snacks. They tend to be rich in calories and fats, and their caloric content outweighs their nutritional value. Some good choices in snacks are graham crackers, melba toast, soda crackers, bagels (no egg), English muffins, fruits, and vegetables. These snacks are preferable to snack crackers, [place] fries, and chips. Popcorn should be air-popped or cooked in small amounts of liquid vegetable oil.  Desserts Eat fruit, low-fat yogurt, and fruit ices instead of pastries, cake, and cookies. Sherbet, angel food cake, gelatin dessert, frozen low-fat yogurt, or other frozen products that do not contain saturated fat (pure fruit juice bars, frozen ice pops) are also acceptable.   COOKING METHODS Choose those methods that use little or no fat. They include: Poaching.  Braising.  Steaming.  Grilling.  Baking.  Stir-frying.  Broiling.  Microwaving.  Foods can be cooked in a nonstick pan without added fat, or use a nonfat cooking spray in regular cookware. Limit fried foods and avoid frying in saturated fat. Add moisture to lean meats by using water, broth, cooking wines, and other nonfat or low-fat sauces along with the cooking methods mentioned above. Soups and stews should be chilled after cooking. The fat that forms on top after a few hours in the refrigerator should be skimmed off. When preparing meals, avoid using excess salt. Salt can contribute to raising blood pressure in some people.  EATING AWAY FROM HOME Order entres,  potatoes, and vegetables without sauces or butter. When meat exceeds the size of a deck of cards (3 to 4 ounces), the rest can be taken home for another meal. Choose vegetable or fruit salads and ask for low-calorie salad dressings to be served on the side. Use dressings sparingly. Limit high-fat toppings, such as bacon, crumbled eggs, cheese, sunflower seeds, and olives. Ask for heart-healthy tub margarine instead of butter.

## 2018-02-17 NOTE — Progress Notes (Signed)
Vital signs were monitored throughout the procedure by Dr. Oneida Alar and Vonda Antigua. They remained stable throughout the procedure.

## 2018-02-20 NOTE — Progress Notes (Signed)
CC'D TO PCP AND ON RECALL  °

## 2018-02-20 NOTE — Progress Notes (Signed)
CC'D TO PCP °

## 2018-02-20 NOTE — Progress Notes (Signed)
PATIENT SCHEDULED AND ON RECALL  °

## 2018-02-21 ENCOUNTER — Encounter: Payer: Self-pay | Admitting: Gastroenterology

## 2018-02-21 NOTE — Telephone Encounter (Signed)
Nurse visit made and letter mailed °

## 2018-02-21 NOTE — Progress Notes (Signed)
Pt is aware of the results and plan. She said she CANNOT have the [place] twice a year, because her copay is $500.00 each time. She said since the last one looked good, she will plan on her next one in a year. She has appt for [facility] Nurse visit and also to follow up in Nov with Walden Field, NP.

## 2018-02-21 NOTE — Progress Notes (Signed)
LMOM for a return call.  

## 2018-02-24 ENCOUNTER — Encounter (HOSPITAL_COMMUNITY): Payer: Self-pay | Admitting: Gastroenterology

## 2018-04-03 ENCOUNTER — Telehealth: Payer: Self-pay | Admitting: General Practice

## 2018-04-03 ENCOUNTER — Ambulatory Visit (INDEPENDENT_AMBULATORY_CARE_PROVIDER_SITE_OTHER): Payer: Self-pay

## 2018-04-03 DIAGNOSIS — Z1211 Encounter for screening for malignant neoplasm of colon: Secondary | ICD-10-CM

## 2018-04-03 NOTE — Telephone Encounter (Signed)
-----   Message from Claudina Lick, LPN sent at 7/57/9728  1:06 PM EDT ----- Rosendo Gros, I tried to explain to this pt how the insurance works with "screening tcs" but she was more confused by the time I got done and she wanted to know if you could call her and explain it better. Her home number is [phone]. ( she works at home so she said that was the best way to reach her)

## 2018-04-03 NOTE — Patient Instructions (Signed)
SPLIT-DOSING PLENVU INSTRUCTION SHEET  Please notify us immediately if you are diabetic, take iron supplements, or if you are on coumadin or any blood thinners.  Patient Name:  Chelsea Estrada Date of procedure:  06/12/18 Time to register at [place] Stay:  8:30am Provider:  Dr.Fields  Please hold the following medications: I will mail you a letter with this information   06/11/18  1 Day prior to procedure:      CLEAR LIQUIDS ALL DAY--NO SOLID FOODS OR DAIRY PRODUCTS!   See list of liquids that are allowed and items that are NOT allowed below.   MAKE SURE YOU DRINK AT LEAST 64 OUNCES OF CLEAR LIQUIDS PRIOR TO STARTING YOUR PREP AND HYDRATE THROUGHOUT YOUR BOWEL PREP!   Diabetic Medication Instructions:  See letter  DO NOT take any oral medications within 1 hour of starting each dose of PLENVU  You can premix your dose of Plenvu and store in refrigerator to get cold BUT you must use within 6 hours!   At 5:00 PM Begin the prep as follows:    1. Empty contents of Dose 1 into the mixing container that comes with PLENVU.  2. Add water to the fill line on the mixing container (at least 16 fluid ounces). Do not add other ingredients to the solution.  3. Thoroughly mix with a spoon or shake with lid on securely until completely dissolved (which may take 2 to 3 minutes).  4. Drink over the next 30 minutes. Be sure to drink all of the solution.  5. Refill the mixing container to the fill line (at least 16 fluid ounces) with clear liquids and drink over the next 30 minutes.   6. Consume additional clear liquids during the evening.  CONTINUE CLEAR LIQUIDS OVERNIGHT. You may take heart, blood pressure or breathing medications.   06/12/18  Day of Procedure   Diabetic Medication Instructions:see letter  DO NOT take any oral medications within 1 hour of starting each dose of PLENVU.    5 hours before procedure @ 4:30am: Drink second dose of Plenvu. 1. Empty the contents of Dose 2  Pouch A and Dose 2 Pouch B into the mixing container that comes with PLENVU.  2. Add water to the fill line on the mixing container (at least 16 fluid ounces).  Do not add other ingredients to the solution. 3. Thoroughly mixed with a spoon or shake with lid on securely until completely dissolved (which may take 2-3 minutes).  Drink over the next 30 minutes.  Be sure to drink all of the solution. 4. Refill the mixing container to the fill line (at least 16 fluid ounces) with clear liquids and drink over the next 30 minutes. 5. Consume additional water or clear liquids up to 3 hours before the colonoscopy. Nothing after 6:30am  You may take TYLENOL products.  Please continue your regular medications unless we have instructed you otherwise.    Please note, on the day of your procedure you MUST be accompanied by an adult who is willing to assume responsibility for you at time of discharge. If you do not have such person with you, your procedure will have to be rescheduled.  Please leave ALL jewelry at home prior to coming to the hospital for your procedure.   *It is your responsibility to check with your insurance company for the benefits of coverage you have for this procedure. Unfortunately, not all insurance companies have benefits to cover all or part of these types of procedures. It is your responsibility to check your benefits, however we will be glad to assist you with any codes your insurance company may need.   Please note that most insurance companies will not cover a screening colonoscopy for people under the age of 36  For example, with some insurance companies you may have benefits for a screening colonoscopy, but if polyps are found the diagnosis will change and then you may have a deductible that will need to be met. Please make sure you check your benefits for screening  colonoscopy as well as a diagnostic colonoscopy.  CLEAR LIQUIDS: (NO RED) Jello Apple Juice  White Grape Juice Water Banana popsicles  Kool-Aid  Coffee(No cream or milk) Tea (No cream or milk) Soft drinks Broth (fat free beef/chicken/vegetable)  Clear liquids allow you to see your fingers on the other side of the glass.  Be sure they are NOT RED in color, cloudy, but CLEAR.  Do Not Eat: Dairy products of any kind Cranberry juice Tomato or V8 Juice  Orange Juice Grapefruit Juice  Red Grape Juice Solid foods like cereal, oatmeal, yogurt, fruits, vegetables, creamed soups, eggs, bread, etc   HELPFUL HINTS TO MAKE DRINKING EASIER: -Make sure prep is extremely COLD. You may drink over ice. -Trying drinking through a straw. -Rinse mouth with water or mouthwash between glasses to remove aftertaste. -Try sipping on a cold beverage/ice popsicles between glasses of prep. -Place a piece of sugar-free hard candy in mouth between glasses. -If you become nauseated, try consuming smaller amounts or stretch out the time between glasses.  Stop for 30 minutes to an hour & slowly start back drinking.  Call our office with any questions or concerns at [phone].  Thank You

## 2018-04-03 NOTE — Telephone Encounter (Signed)
I spoke with the patient and provided her with cpt codes to give to her insurance company so they could provide her with coverage information.  She stated she will call her insurance company and call me back with a reference# for her call to document in her chart.

## 2018-04-03 NOTE — Progress Notes (Signed)
Gastroenterology Pre-Procedure Review  Request Date:04/03/18 Requesting Physician: Dr.Fields (no previous tcs) see 02/17/18 phone note- pt was given a plenvu sample  PATIENT REVIEW QUESTIONS: The patient responded to the following health history questions as indicated:    1. Diabetes Melitis: yes (on glucovance, januvia and victoza) 2. Joint replacements in the past 12 months: no 3. Major health problems in the past 3 months: yes (has lupus) 4. Has an artificial valve or MVP: no 5. Has a defibrillator: no 6. Has been advised in past to take antibiotics in advance of a procedure like teeth cleaning: no 7. Family history of colon cancer: no  8. Alcohol Use: no 9. History of sleep apnea: no  10. History of coronary artery or other vascular stents placed within the last 12 months: no 11. History of any prior anesthesia complications: no    MEDICATIONS & ALLERGIES:    Patient reports the following regarding taking any blood thinners:   Plavix? no Aspirin? no Coumadin? no Brilinta? no Xarelto? no Eliquis? no Pradaxa? no Savaysa? no Effient? no  Patient confirms/reports the following medications:  Current Outpatient Medications  Medication Sig Dispense Refill  . acetaminophen (TYLENOL) 325 MG tablet Take 2 tablets (650 mg total) by mouth every 8 (eight) hours as needed for mild pain or moderate pain. NO MORE THAN 6 TABLETS A DAY 500 tablet 0  . atorvastatin (LIPITOR) 10 MG tablet Take 10 mg by mouth daily.     . fenofibrate 160 MG tablet Take 145 mg by mouth at bedtime.     Marland Kitchen glyBURIDE-metformin (GLUCOVANCE) 5-500 MG per tablet Take 2 tablets by mouth 2 (two) times daily with a meal.    . hydroxychloroquine (PLAQUENIL) 200 MG tablet Take 200 mg daily by mouth.     Marland Kitchen JANUVIA 100 MG tablet Take 100 mg by mouth daily.     Javier Docker Oil 500 MG CAPS Take 500 mg by mouth daily.    Marland Kitchen levothyroxine (SYNTHROID, LEVOTHROID) 25 MCG tablet Take 25 mcg by mouth daily before breakfast.    .  Liraglutide (VICTOZA) 18 MG/3ML SOPN Inject 1.8 mg into the skin every morning.    Marland Kitchen lisinopril (PRINIVIL,ZESTRIL) 5 MG tablet Take 5 mg by mouth every morning.    . magnesium oxide (MAG-OX) 400 MG tablet Take 400 mg by mouth daily.    . metoprolol succinate (TOPROL-XL) 50 MG 24 hr tablet Take 50 mg by mouth every morning. Take with or immediately following a meal.    . Milk Thistle 1000 MG CAPS Take 1,000 mg by mouth daily.     . niacin (NIASPAN) 500 MG CR tablet Take 500 mg by mouth at bedtime.    . niacin 500 MG tablet Take 500 mg by mouth every morning.    Marland Kitchen omeprazole (PRILOSEC) 20 MG capsule 1 PO 30 mins prior to breakfast and supper (Patient taking differently: Take 20 mg by mouth daily. 1 PO 30 mins prior to breakfast and supper) 180 capsule 3   No current facility-administered medications for this visit.     Patient confirms/reports the following allergies:  Allergies  Allergen Reactions  . Penicillins Hives    Childhood Allergy Has patient had a PCN reaction causing immediate rash, facial/tongue/throat swelling, SOB or lightheadedness with hypotension: No Has patient had a PCN reaction causing severe rash involving mucus membranes or skin necrosis: No Has patient had a PCN reaction that required hospitalization: No Has patient had a PCN reaction occurring within the last 10 years:  No If all of the above answers are "NO", then may proceed with Cephalosporin use.   Marland Kitchen Doxycycline Rash    Vaginal rash    No orders of the defined types were placed in this encounter.   AUTHORIZATION INFORMATION Primary Insurance: [employer] [place],  ID #: [id number] Pre-Cert / Auth required: no   SCHEDULE INFORMATION: Procedure has been scheduled as follows:  Date: 06/12/18, Time: 9:30 Location: [facility]  This Gastroenterology Pre-Precedure Review Form is being routed to the following provider(s): Walden Field NP

## 2018-04-05 NOTE — Progress Notes (Signed)
Pt had stated she would move up if anyone cancels. We had an opening on Monday 04/10/18, I called the pt and she accepted the 9:15 opening. I went over her instructions and times. Pt is aware of DM medication instructions and clear liquid diet instructions.  I have called Hoyle Sauer and moved her appt.

## 2018-04-05 NOTE — Progress Notes (Signed)
Ok to schedule.  DM Meds: The day before, half night before; none morning of.

## 2018-04-10 ENCOUNTER — Ambulatory Visit (HOSPITAL_COMMUNITY)
Admission: RE | Admit: 2018-04-10 | Discharge: 2018-04-10 | Disposition: A | Discharge status: Home / Self Care | Payer: BC Managed Care – PPO | Source: Ambulatory Visit | Attending: Gastroenterology | Admitting: Gastroenterology

## 2018-04-10 ENCOUNTER — Encounter (HOSPITAL_COMMUNITY): Payer: Self-pay

## 2018-04-10 ENCOUNTER — Ambulatory Visit (HOSPITAL_COMMUNITY)
Admission: RE | Disposition: A | Discharge status: Home / Self Care | Payer: Self-pay | Source: Ambulatory Visit | Attending: Gastroenterology

## 2018-04-10 ENCOUNTER — Other Ambulatory Visit: Payer: Self-pay

## 2018-04-10 DIAGNOSIS — Z7984 Long term (current) use of oral hypoglycemic drugs: Secondary | ICD-10-CM | POA: Insufficient documentation

## 2018-04-10 DIAGNOSIS — E119 Type 2 diabetes mellitus without complications: Secondary | ICD-10-CM | POA: Insufficient documentation

## 2018-04-10 DIAGNOSIS — Z7982 Long term (current) use of aspirin: Secondary | ICD-10-CM | POA: Diagnosis not present

## 2018-04-10 DIAGNOSIS — Q438 Other specified congenital malformations of intestine: Secondary | ICD-10-CM | POA: Diagnosis not present

## 2018-04-10 DIAGNOSIS — K5730 Diverticulosis of large intestine without perforation or abscess without bleeding: Secondary | ICD-10-CM | POA: Insufficient documentation

## 2018-04-10 DIAGNOSIS — K644 Residual hemorrhoidal skin tags: Secondary | ICD-10-CM | POA: Insufficient documentation

## 2018-04-10 DIAGNOSIS — Z1211 Encounter for screening for malignant neoplasm of colon: Secondary | ICD-10-CM | POA: Insufficient documentation

## 2018-04-10 DIAGNOSIS — K648 Other hemorrhoids: Secondary | ICD-10-CM | POA: Insufficient documentation

## 2018-04-10 DIAGNOSIS — M329 Systemic lupus erythematosus, unspecified: Secondary | ICD-10-CM | POA: Diagnosis not present

## 2018-04-10 DIAGNOSIS — I10 Essential (primary) hypertension: Secondary | ICD-10-CM | POA: Insufficient documentation

## 2018-04-10 DIAGNOSIS — Z79899 Other long term (current) drug therapy: Secondary | ICD-10-CM | POA: Insufficient documentation

## 2018-04-10 DIAGNOSIS — E039 Hypothyroidism, unspecified: Secondary | ICD-10-CM | POA: Insufficient documentation

## 2018-04-10 DIAGNOSIS — F17210 Nicotine dependence, cigarettes, uncomplicated: Secondary | ICD-10-CM | POA: Insufficient documentation

## 2018-04-10 HISTORY — PX: COLONOSCOPY: SHX5424

## 2018-04-10 LAB — GLUCOSE, CAPILLARY: GLUCOSE-CAPILLARY: 176 mg/dL — AB (ref 70–99)

## 2018-04-10 SURGERY — COLONOSCOPY
Anesthesia: Moderate Sedation

## 2018-04-10 MED ORDER — MEPERIDINE HCL 100 MG/ML IJ SOLN
INTRAMUSCULAR | Status: AC
  Filled 2018-04-10: qty 2

## 2018-04-10 MED ORDER — MIDAZOLAM HCL 5 MG/5ML IJ SOLN
INTRAMUSCULAR | Status: DC | PRN
  Administered 2018-04-10: 2 mg via INTRAVENOUS
  Administered 2018-04-10: 1 mg via INTRAVENOUS
  Administered 2018-04-10: 2 mg via INTRAVENOUS

## 2018-04-10 MED ORDER — PROMETHAZINE HCL 25 MG/ML IJ SOLN
INTRAMUSCULAR | Status: AC
  Filled 2018-04-10: qty 1

## 2018-04-10 MED ORDER — MIDAZOLAM HCL 5 MG/5ML IJ SOLN
INTRAMUSCULAR | Status: AC
  Filled 2018-04-10: qty 10

## 2018-04-10 MED ORDER — SODIUM CHLORIDE 0.9% FLUSH
INTRAVENOUS | Status: AC
  Administered 2018-04-10: 10 mL
  Filled 2018-04-10: qty 10

## 2018-04-10 MED ORDER — SODIUM CHLORIDE 0.9 % IV SOLN
INTRAVENOUS | Status: DC
  Administered 2018-04-10: 09:00:00 via INTRAVENOUS

## 2018-04-10 MED ORDER — MEPERIDINE HCL 100 MG/ML IJ SOLN
INTRAMUSCULAR | Status: DC | PRN
  Administered 2018-04-10 (×2): 25 mg via INTRAVENOUS
  Administered 2018-04-10: 50 mg via INTRAVENOUS

## 2018-04-10 MED ORDER — PROMETHAZINE HCL 25 MG/ML IJ SOLN
12.5000 mg | Freq: Once | INTRAMUSCULAR | Status: AC
  Administered 2018-04-10: 12.5 mg via INTRAVENOUS

## 2018-04-10 MED ORDER — STERILE WATER FOR IRRIGATION IR SOLN
Status: DC | PRN
  Administered 2018-04-10: 1.5 mL

## 2018-04-10 NOTE — Discharge Instructions (Signed)
You DID NOT HAVE ANY POLYPS. YOU HAVE DIVERTICULOSIS IN YOUR LEFT colon. You have internal hemorrhoids.    DRINK WATER TO KEEP URINE LIGHT YELLOW.  CONTINUE YOUR WEIGHT LOSS EFFORTS.  WHILE I DO NOT WANT TO ALARM YOU, YOUR BODY MASS INDEX IS OVER 40 WHICH MEANS YOU ARE MORBIDLY OBESE. OBESITY CAN ACTIVATE CANCER GENES. OBESITY IS ASSOCIATED WITH AN INCREASED RISK FOR WORSENING CIRRHOSIS AND ALL CANCERS, INCLUDING ESOPHAGEAL AND COLON CANCER. THE FIRST STEP IS TO GET YOUR BMI UNDER 40. A WEIGHT OF  224 LBS OR LESS WILL GET YOUR BODY MASS INDEX(BMI) UNDER 40. THE NEXT STEP IS TO GET YOUR BMI UNDER 30. A WEIGHT OF 167 LBS OR LESS WILL GET YOUR BODY MASS INDEX(BMI) UNDER 30.  IF YOU FEEL LIKE YOU CANNOT LOSE THE WEIGHT ON YOUR OWN, PLEASE CALL FOR A REFERRAL TO THE BARIATRIC WEIGHT LOSS CLINIC.   FOLLOW A HIGH FIBER DIET. AVOID ITEMS THAT CAUSE BLOATING & GAS. SEE INFO BELOW.  USE PREPARATION H FOUR TIMES  A DAY IF NEEDED TO RELIEVE RECTAL PAIN/PRESSURE/BLEEDING.  FOLLOW UP IN NOV 2019.   Next colonoscopy in 10 years.   Colonoscopy Care After Read the instructions outlined below and refer to this sheet in the next week. These discharge instructions provide you with general information on caring for yourself after you leave the hospital. While your treatment has been planned according to the most current medical practices available, unavoidable complications occasionally occur. If you have any problems or questions after discharge, call DR. Adelle Zachar, [phone].  ACTIVITY  You may resume your regular activity, but move at a slower pace for the next 24 hours.   Take frequent rest periods for the next 24 hours.   Walking will help get rid of the air and reduce the bloated feeling in your belly (abdomen).   No driving for 24 hours (because of the medicine (anesthesia) used during the test).   You may shower.   Do not sign any important legal documents or operate any machinery for 24 hours  (because of the anesthesia used during the test).    NUTRITION  Drink plenty of fluids.   You may resume your normal diet as instructed by your doctor.   Begin with a light meal and progress to your normal diet. Heavy or fried foods are harder to digest and may make you feel sick to your stomach (nauseated).   Avoid alcoholic beverages for 24 hours or as instructed.    MEDICATIONS  You may resume your normal medications.   WHAT YOU CAN EXPECT TODAY  Some feelings of bloating in the abdomen.   Passage of more gas than usual.   Spotting of blood in your stool or on the toilet paper  .  IF YOU HAD POLYPS REMOVED DURING THE COLONOSCOPY:  Eat a soft diet IF YOU HAVE NAUSEA, BLOATING, ABDOMINAL PAIN, OR VOMITING.    FINDING OUT THE RESULTS OF YOUR TEST Not all test results are available during your visit. DR. Oneida Alar WILL CALL YOU WITHIN 7 DAYS OF YOUR PROCEDUE WITH YOUR RESULTS. Do not assume everything is normal if you have not heard from DR. Aubryanna Nesheim IN ONE WEEK, CALL HER OFFICE AT [phone].  SEEK IMMEDIATE MEDICAL ATTENTION AND CALL THE OFFICE: [phone] IF:  You have more than a spotting of blood in your stool.   Your belly is swollen (abdominal distention).   You are nauseated or vomiting.   You have a temperature over 101F.  You have abdominal pain or discomfort that is severe or gets worse throughout the day.    High-Fiber Diet A high-fiber diet changes your normal diet to include more whole grains, legumes, fruits, and vegetables. Changes in the diet involve replacing refined carbohydrates with unrefined foods. The calorie level of the diet is essentially unchanged. The Dietary Reference Intake (recommended amount) for adult males is 38 grams per day. For adult females, it is 25 grams per day. Pregnant and lactating women should consume 28 grams of fiber per day. Fiber is the intact part of a plant that is not broken down during digestion. Functional fiber  is fiber that has been isolated from the plant to provide a beneficial effect in the body.  PURPOSE  Increase stool bulk.   Ease and regulate bowel movements.   Lower cholesterol.   REDUCE RISK OF COLON CANCER  INDICATIONS THAT YOU NEED MORE FIBER  Constipation and hemorrhoids.   Uncomplicated diverticulosis (intestine condition) and irritable bowel syndrome.   Weight management.   As a protective measure against hardening of the arteries (atherosclerosis), diabetes, and cancer.   GUIDELINES FOR INCREASING FIBER IN THE DIET  Start adding fiber to the diet slowly. A gradual increase of about 5 more grams (2 slices of whole-wheat bread, 2 servings of most fruits or vegetables, or 1 bowl of high-fiber cereal) per day is best. Too rapid an increase in fiber may result in constipation, flatulence, and bloating.   Drink enough water and fluids to keep your urine clear or pale yellow. Water, juice, or caffeine-free drinks are recommended. Not drinking enough fluid may cause constipation.   Eat a variety of high-fiber foods rather than one type of fiber.   Try to increase your intake of fiber through using high-fiber foods rather than fiber pills or supplements that contain small amounts of fiber.   The goal is to change the types of food eaten. Do not supplement your present diet with high-fiber foods, but replace foods in your present diet.   INCLUDE A VARIETY OF FIBER SOURCES  Replace refined and processed grains with whole grains, canned fruits with fresh fruits, and incorporate other fiber sources. White rice, white breads, and most bakery goods contain little or no fiber.   Brown whole-grain rice, buckwheat oats, and many fruits and vegetables are all good sources of fiber. These include: broccoli, Brussels sprouts, cabbage, cauliflower, beets, sweet potatoes, white potatoes (skin on), carrots, tomatoes, eggplant, squash, berries, fresh fruits, and dried fruits.   Cereals appear  to be the richest source of fiber. Cereal fiber is found in whole grains and bran. Bran is the fiber-rich outer coat of cereal grain, which is largely removed in refining. In whole-grain cereals, the bran remains. In breakfast cereals, the largest amount of fiber is found in those with "bran" in their names. The fiber content is sometimes indicated on the label.   You may need to include additional fruits and vegetables each day.   In baking, for 1 cup white flour, you may use the following substitutions:   1 cup whole-wheat flour minus 2 tablespoons.   1/2 cup white flour plus 1/2 cup whole-wheat flour.   Diverticulosis Diverticulosis is a common condition that develops when small pouches (diverticula) form in the wall of the colon. The risk of diverticulosis increases with age. It happens more often in people who eat a low-fiber diet. Most individuals with diverticulosis have no symptoms. Those individuals with symptoms usually experience belly (abdominal) pain, constipation,  or loose stools (diarrhea).  HOME CARE INSTRUCTIONS  Increase the amount of fiber in your diet as directed by your caregiver or dietician. This may reduce symptoms of diverticulosis.   Drink at least 6 to 8 glasses of water each day to prevent constipation.   Try not to strain when you have a bowel movement.   THERE IS NO NEED TO Avoid nuts and seeds to prevent complications.   FOODS HAVING HIGH FIBER CONTENT INCLUDE:  Fruits. Apple, peach, pear, tangerine, raisins, prunes.   Vegetables. Brussels sprouts, asparagus, broccoli, cabbage, carrot, cauliflower, romaine lettuce, spinach, summer squash, tomato, winter squash, zucchini.   Starchy Vegetables. Baked beans, kidney beans, lima beans, split peas, lentils, potatoes (with skin).   Grains. Whole wheat bread, brown rice, bran flake cereal, plain oatmeal, white rice, shredded wheat, bran muffins.

## 2018-04-10 NOTE — Op Note (Signed)
[facility] Patient Name: Chelsea Estrada Procedure Date: 04/10/2018 9:27 AM MRN: [id number] Date of Birth: [birth year] Attending MD: Barney Drain MD, MD CSN: [id number] Age: 52 Admit Type: Outpatient Procedure:                Colonoscopy, SCREENING Indications:              Screening for colorectal malignant neoplasm Providers:                Barney Drain MD, MD, Lurline Del, RN, Nelma Rothman,                            Technician Referring MD:             Jasper Loser. Luan Pulling MD, MD Medicines:                Promethazine 12.5 mg IV, Meperidine 100 mg IV,                            Midazolam 5 mg IV Complications:            No immediate complications. Estimated Blood Loss:     Estimated blood loss: none. Procedure:                Pre-Anesthesia Assessment:                           - Prior to the procedure, a History and Physical                            was performed, and patient medications and                            allergies were reviewed. The patient's tolerance of                            previous anesthesia was also reviewed. The risks                            and benefits of the procedure and the sedation                            options and risks were discussed with the patient.                            All questions were answered, and informed consent                            was obtained. Prior Anticoagulants: The patient has                            taken no previous anticoagulant or antiplatelet                            agents. ASA Grade Assessment: II - A patient with  mild systemic disease. After reviewing the risks                            and benefits, the patient was deemed in                            satisfactory condition to undergo the procedure.                            After obtaining informed consent, the colonoscope                            was passed under direct vision. Throughout the                             procedure, the patient's blood pressure, pulse, and                            oxygen saturations were monitored continuously. The                            [id number]-[id number] ([id number]) scope was introduced through                            the anus and advanced to the the cecum, identified                            by appendiceal orifice and ileocecal valve. The                            colonoscopy was somewhat difficult due to                            restricted mobility of the colon. Successful                            completion of the procedure was aided by                            straightening and shortening the scope to obtain                            bowel loop reduction and COLOWRAP. The patient                            tolerated the procedure fairly well. The quality of                            the bowel preparation was excellent. The ileocecal                            valve, appendiceal orifice, and rectum were  photographed. Scope In: 9:49:36 AM Scope Out: 10:04:13 AM Scope Withdrawal Time: 0 hours 9 minutes 43 seconds  Total Procedure Duration: 0 hours 14 minutes 37 seconds  Findings:      Multiple small and large-mouthed diverticula were found in the       recto-sigmoid colon and sigmoid colon.      The recto-sigmoid colon and sigmoid colon were moderately tortuous.      External and internal hemorrhoids were found. The hemorrhoids were       moderate and small. Impression:               - Diverticulosis in the recto-sigmoid colon and in                            the sigmoid colon.                           - Tortuous LEFT colon.                           - External and internal hemorrhoids. Moderate Sedation:      Moderate (conscious) sedation was administered by the endoscopy nurse       and supervised by the endoscopist. The following parameters were       monitored: oxygen saturation, heart rate, blood pressure, and response        to care. Total physician intraservice time was 33 minutes. Recommendation:           - Patient has a contact number available for                            emergencies. The signs and symptoms of potential                            delayed complications were discussed with the                            patient. Return to normal activities tomorrow.                            Written discharge instructions were provided to the                            patient.                           - High fiber diet.                           - Continue present medications.                           - Repeat colonoscopy in 10 years for surveillance. Procedure Code(s):        --- Professional ---                           [phone], Colonoscopy, flexible; diagnostic, including  collection of specimen(s) by brushing or washing,                            when performed (separate procedure)                           G0500, Moderate sedation services provided by the                            same physician or other qualified health care                            professional performing a gastrointestinal                            endoscopic service that sedation supports,                            requiring the presence of an independent trained                            observer to assist in the monitoring of the                            patient's level of consciousness and physiological                            status; initial 15 minutes of intra-service time;                            patient age 13 years or older (additional time may                            be reported with [phone], as appropriate)                           [phone], Moderate sedation services provided by the                            same physician or other qualified health care                            professional performing the diagnostic or                            therapeutic service  that the sedation supports,                            requiring the presence of an independent trained                            observer to assist in the monitoring of the  patient's level of consciousness and physiological                            status; each additional 15 minutes intraservice                            time (List separately in addition to code for                            primary service) Diagnosis Code(s):        --- Professional ---                           Z12.11, Encounter for screening for malignant                            neoplasm of colon                           K64.8, Other hemorrhoids                           K57.30, Diverticulosis of large intestine without                            perforation or abscess without bleeding                           Q43.8, Other specified congenital malformations of                            intestine CPT copyright 2017 [employer]. All rights reserved. The codes documented in this report are preliminary and upon coder review may  be revised to meet current compliance requirements. Barney Drain, MD Barney Drain MD, MD 04/10/2018 10:20:04 AM This report has been signed electronically. Number of Addenda: 0

## 2018-04-10 NOTE — H&P (Signed)
Primary Care Physician:  Sinda Du, MD Primary Gastroenterologist:  Dr. Oneida Alar  Pre-Procedure History & Physical: HPI:  Chelsea Estrada is a 52 y.o. female here for COLON CANCER SCREENING.  Past Medical History:  Diagnosis Date  . Family history of adverse reaction to anesthesia    mother-- PONV  . Frequency of urination   . History of kidney stones   . Hypertension   . Hypothyroidism   . Lupus ([place])    effects joints--  rheumologist-  dr Amil Amen w/ [facility]  . Right ureteral stone   . Type 2 diabetes mellitus ([place])   . Urgency of urination   . Wears glasses     Past Surgical History:  Procedure Laterality Date  . BIOPSY  02/17/2018   Procedure: BIOPSY;  Surgeon: Danie Binder, MD;  [facility];  Service: Endoscopy;;  duodenum;  . CHOLECYSTECTOMY OPEN  age 63's  . CYSTOSCOPY WITH RETROGRADE PYELOGRAM, URETEROSCOPY AND STENT PLACEMENT Right 04/04/2015   Procedure: CYSTOSCOPY WITH RETROGRADE PYELOGRAM, URETEROSCOPY AND STENT PLACEMENT;  Surgeon: Alexis Frock, MD;  Location: [facility];  Service: Urology;  Laterality: Right;  . ESOPHAGOGASTRODUODENOSCOPY N/A 08/25/2015   Procedure: ESOPHAGOGASTRODUODENOSCOPY (EGD);  Surgeon: Danie Binder, MD;  [facility];  Service: Endoscopy;  Laterality: N/A;  830  . ESOPHAGOGASTRODUODENOSCOPY N/A 02/17/2018   Procedure: ESOPHAGOGASTRODUODENOSCOPY (EGD);  Surgeon: Danie Binder, MD;  [facility];  Service: Endoscopy;  Laterality: N/A;  10:15am  . HOLMIUM LASER APPLICATION Right 3/90/3009   Procedure: HOLMIUM LASER APPLICATION;  Surgeon: Alexis Frock, MD;  Location: [facility];  Service: Urology;  Laterality: Right;    Prior to Admission medications   Medication Sig Start Date End Date Taking? Authorizing Provider  atorvastatin (LIPITOR) 10 MG tablet Take 10 mg by mouth daily.  12/19/17  Yes [provider]  fenofibrate 160 MG tablet Take 145  mg by mouth at bedtime.    Yes [provider]  glyBURIDE-metformin (GLUCOVANCE) 5-500 MG per tablet Take 2 tablets by mouth 2 (two) times daily with a meal.   Yes [provider]  hydroxychloroquine (PLAQUENIL) 200 MG tablet Take 200 mg daily by mouth.    Yes [provider]  JANUVIA 100 MG tablet Take 100 mg by mouth daily.  01/16/18  Yes [provider]  Javier Docker Oil 500 MG CAPS Take 500 mg by mouth daily.   Yes [provider]  levothyroxine (SYNTHROID, LEVOTHROID) 25 MCG tablet Take 25 mcg by mouth daily before breakfast.   Yes [provider]  Liraglutide (VICTOZA) 18 MG/3ML SOPN Inject 1.8 mg into the skin every morning.   Yes [provider]  lisinopril (PRINIVIL,ZESTRIL) 5 MG tablet Take 5 mg by mouth every morning.   Yes [provider]  magnesium oxide (MAG-OX) 400 MG tablet Take 400 mg by mouth daily.   Yes [provider]  metoprolol succinate (TOPROL-XL) 50 MG 24 hr tablet Take 50 mg by mouth every morning. Take with or immediately following a meal.   Yes [provider]  Milk Thistle 1000 MG CAPS Take 1,000 mg by mouth daily.    Yes [provider]  niacin (NIASPAN) 500 MG CR tablet Take 500 mg by mouth at bedtime.   Yes [provider]  niacin 500 MG tablet Take 500 mg by mouth every morning.   Yes [provider]  omeprazole (PRILOSEC) 20 MG capsule 1 PO 30 mins prior to  breakfast and supper Patient taking differently: Take 20 mg by mouth daily. 1 PO 30 mins prior to breakfast and supper 09/07/17  Yes Annitta Needs, NP  acetaminophen (TYLENOL) 325 MG tablet Take 2 tablets (650 mg total) by mouth every 8 (eight) hours as needed for mild pain or moderate pain. NO MORE THAN 6 TABLETS A DAY 08/25/15   Danie Binder, MD    Allergies as of 04/03/2018 - Review Complete 04/03/2018  Allergen Reaction Noted  . Penicillins Hives 10/26/2010  . Doxycycline Rash 07/18/2017     Family History  Problem Relation Age of Onset  . Liver disease Mother   . Diabetes type II Mother   . Hypertension Mother   . Diabetes Mellitus II Father   . Hypertension Father   . Heart disease Father   . Colon cancer Neg Hx     Social History   Socioeconomic History  . Marital status: Married    Spouse name: Not on file  . Number of children: Not on file  . Years of education: Not on file  . Highest education level: Not on file  Occupational History  . Not on file  Social Needs  . Financial resource strain: Not on file  . Food insecurity:    Worry: Not on file    Inability: Not on file  . Transportation needs:    Medical: Not on file    Non-medical: Not on file  Tobacco Use  . Smoking status: Current Every Day Smoker    Packs/day: 1.00    Years: 0.00    Pack years: 0.00    Types: Cigarettes  . Smokeless tobacco: Never Used  . Tobacco comment: one pack daily  Substance and Sexual Activity  . Alcohol use: Yes    Alcohol/week: 0.0 oz    Comment: Very rarely: typically once every 6+ months; typically 1 drink per occurrence.  . Drug use: No  . Sexual activity: Not on file  Lifestyle  . Physical activity:    Days per week: Not on file    Minutes per session: Not on file  . Stress: Not on file  Relationships  . Social connections:    Talks on phone: Not on file    Gets together: Not on file    Attends religious service: Not on file    Active member of club or organization: Not on file    Attends meetings of clubs or organizations: Not on file    Relationship status: Not on file  . Intimate partner violence:    Fear of current or ex partner: Not on file    Emotionally abused: Not on file    Physically abused: Not on file    Forced sexual activity: Not on file  Other Topics Concern  . Not on file  Social History Narrative  . Not on file    Review of Systems: See HPI, otherwise negative ROS   Physical Exam: BP 120/70   Pulse 90   Temp 98.7 F  (37.1 C) (Oral)   Resp 18   Ht 5\' 3"  (1.6 m)   Wt 227 lb (103 kg)   SpO2 98%   BMI 40.21 kg/m  General:   Alert,  pleasant and cooperative in NAD Head:  Normocephalic and atraumatic. Neck:  Supple; Lungs:  Clear throughout to auscultation.    Heart:  Regular rate and rhythm. Abdomen:  Soft, nontender and nondistended. Normal bowel sounds, without guarding, and without rebound.   Neurologic:  Alert and  oriented x4;  grossly normal neurologically.  Impression/Plan:    SCREENING  Plan:  1. TCS TODAY DISCUSSED PROCEDURE, BENEFITS, & RISKS: < 1% chance of medication reaction, bleeding, perforation, or rupture of spleen/liver.

## 2018-04-14 ENCOUNTER — Encounter (HOSPITAL_COMMUNITY): Payer: Self-pay | Admitting: Gastroenterology

## 2018-04-17 MED FILL — Promethazine HCl Inj 25 MG/ML: INTRAMUSCULAR | Qty: 1 | Status: AC

## 2018-06-15 ENCOUNTER — Telehealth: Payer: Self-pay | Admitting: Gastroenterology

## 2018-06-15 NOTE — Telephone Encounter (Signed)
NOV RECALL FOR ULTRASOUND °

## 2018-06-15 NOTE — Telephone Encounter (Signed)
Letter mailed

## 2018-07-26 ENCOUNTER — Encounter: Payer: Self-pay | Admitting: *Deleted

## 2018-07-26 ENCOUNTER — Encounter: Payer: Self-pay | Admitting: Nurse Practitioner

## 2018-07-26 ENCOUNTER — Ambulatory Visit: Payer: BC Managed Care – PPO | Admitting: Nurse Practitioner

## 2018-07-26 VITALS — BP 111/73 | HR 88 | Temp 97.0°F | Ht 63.0 in | Wt 226.0 lb

## 2018-07-26 DIAGNOSIS — I8510 Secondary esophageal varices without bleeding: Secondary | ICD-10-CM | POA: Diagnosis not present

## 2018-07-26 DIAGNOSIS — K7460 Unspecified cirrhosis of liver: Secondary | ICD-10-CM | POA: Diagnosis not present

## 2018-07-26 DIAGNOSIS — R197 Diarrhea, unspecified: Secondary | ICD-10-CM

## 2018-07-26 MED ORDER — DICYCLOMINE HCL 10 MG PO CAPS: 10.0000 mg | ORAL_CAPSULE | Freq: Four times a day (QID) | ORAL | 3 refills | Status: DC | PRN

## 2018-07-26 NOTE — Assessment & Plan Note (Signed)
The patient has known cirrhosis with varices.  She is due for imaging and labs.  She has labs ordered by her primary care.  We will add an INR and AFP.  Primary care has agreed to fax [place] her lab results.  She is also due for right upper quadrant ultrasound.  We will schedule this today.  Follow-up in 6 months.

## 2018-07-26 NOTE — Addendum Note (Signed)
Addended by: Gordy Levan, Alysia Scism A on: 07/26/2018 04:32 PM   Modules accepted: Orders

## 2018-07-26 NOTE — Patient Instructions (Signed)
1. I have sent a prescription for Bentyl 10 mg to your pharmacy.  You can take this 4 times a day, as needed for abdominal pain or diarrhea. 2. Have your labs drawn when you are able to. 3. We will help schedule your ultrasound for you. 4. Return for follow-up in 6 months. 5. Call us if you have any questions or concerns.  At [facility] we value your feedback. You may receive a survey about your visit today. Please share your experience as we strive to create trusting relationships with our patients to provide genuine, compassionate, quality care.  We appreciate your understanding and patience as we review any laboratory studies, imaging, and other diagnostic tests that are ordered as we care for you. Our office policy is 5 business days for review of these results, and any emergent or urgent results are addressed in a timely manner for your best interest. If you do not hear from our office in 1 week, please contact us.   We also encourage the use of MyChart, which contains your medical information for your review as well. If you are not enrolled in this feature, an access code is on this after visit summary for your convenience. Thank you for allowing [place] to be involved in your care.  It was great to meet you today!  I hope you have a Happy Thanksgiving!!

## 2018-07-26 NOTE — Assessment & Plan Note (Signed)
The patient has persistent, intermittent diarrhea.  This seems to be triggered with stress.  Her diarrhea flared up when she lost her dog unexpectedly at young age.  She was put on Creon by primary care and feels this is not helped.  I recommended Bentyl 10 mg 4 times a day as needed.  I will send a prescription into her pharmacy.  Return for follow-up in 6 months.

## 2018-07-26 NOTE — Progress Notes (Signed)
Referring Provider: Sinda Du, MD Primary Care Physician:  Sinda Du, MD Primary GI:  Dr. Oneida Alar  Chief Complaint  Patient presents with  . Cirrhosis    HPI:   Chelsea Estrada is a 52 y.o. female who presents for follow-up on cirrhosis.  Patient was last seen in our office 01/19/2018 for cirrhosis, diarrhea, esophageal varices.  Diarrhea was felt to be related to stress with her father's passing.  At her last visit her diarrhea significantly improved, is worse/flare with stress.  Denies overt hepatic and GI symptoms.  Last EGD 2016 with grade 1 varices and recommended 2-year repeat, currently due.  Her liver disease has historically been well compensated with her last meld score calculated at 6, child Pugh class A.  Right upper quadrant ultrasound last completed 07/25/2017 which found cirrhotic liver without discrete mass.  Recommended EGD for surveillance, updated labs, follow-up in 6 months.  EGD was completed 02/17/2018 and found nonobstructing Schatzki's ring, erosive gastritis due to aspirin, nonbleeding diverticulum.  Recommended repeat EGD in 3 years for surveillance of varices (2021).  Recommended repeat right upper quadrant ultrasound in June 2019.  In the interim she had a nurse triage and screening colonoscopy was completed on 04/10/2018.  Colonoscopy found diverticulosis in the rectosigmoid colon and sigmoid colon, tortuous left colon, external and internal hemorrhoids.  Recommended repeat colonoscopy in 10 years (2029).  It appears her labs were ordered but have not been completed.  Today she is currently due for updated imaging and labs for routine cirrhosis care.  Today she states she's doing well overall. Is dealing with a bad case of psoriasis. Her dog just died unexpectedly at a young age 37 months ago. Denies abdominal pain, N/V, hematochezia, melena, fever, chills, unintentional weight loss. Denies yellowing of skin/eyes, darkened urine, acute episodic  confusion, tremors/shakes, generalized pruritis. Has intermittent chronic diarrhea which is stress-related. Denies chest pain, dyspnea, dizziness, lightheadedness, syncope, near syncope. Denies any other upper or lower GI symptoms.  Her PCP checked what sounds to be fecal elastase (EPI) and was started on Creon. She tried it, and saw no difference.  Past Medical History:  Diagnosis Date  . Family history of adverse reaction to anesthesia    mother-- PONV  . Frequency of urination   . History of kidney stones   . Hypertension   . Hypothyroidism   . Lupus ([place])    effects joints--  rheumologist-  dr Amil Amen w/ [facility]  . Right ureteral stone   . Type 2 diabetes mellitus ([place])   . Urgency of urination   . Wears glasses     Past Surgical History:  Procedure Laterality Date  . BIOPSY  02/17/2018   Procedure: BIOPSY;  Surgeon: Danie Binder, MD;  [facility];  Service: Endoscopy;;  duodenum;  . CHOLECYSTECTOMY OPEN  age 5's  . COLONOSCOPY N/A 04/10/2018   Procedure: COLONOSCOPY;  Surgeon: Danie Binder, MD;  [facility];  Service: Endoscopy;  Laterality: N/A;  9:30  . CYSTOSCOPY WITH RETROGRADE PYELOGRAM, URETEROSCOPY AND STENT PLACEMENT Right 04/04/2015   Procedure: CYSTOSCOPY WITH RETROGRADE PYELOGRAM, URETEROSCOPY AND STENT PLACEMENT;  Surgeon: Alexis Frock, MD;  Location: [facility];  Service: Urology;  Laterality: Right;  . ESOPHAGOGASTRODUODENOSCOPY N/A 08/25/2015   Procedure: ESOPHAGOGASTRODUODENOSCOPY (EGD);  Surgeon: Danie Binder, MD;  [facility];  Service: Endoscopy;  Laterality: N/A;  830  . ESOPHAGOGASTRODUODENOSCOPY N/A 02/17/2018   Procedure: ESOPHAGOGASTRODUODENOSCOPY (EGD);  Surgeon: Oneida Alar,  Marga Melnick, MD;  [facility];  Service: Endoscopy;  Laterality: N/A;  10:15am  . HOLMIUM LASER APPLICATION Right 0/27/7412   Procedure: HOLMIUM LASER APPLICATION;  Surgeon: Alexis Frock, MD;  Location:  [facility];  Service: Urology;  Laterality: Right;    Current Outpatient Medications  Medication Sig Dispense Refill  . acetaminophen (TYLENOL) 325 MG tablet Take 2 tablets (650 mg total) by mouth every 8 (eight) hours as needed for mild pain or moderate pain. NO MORE THAN 6 TABLETS A DAY 500 tablet 0  . atorvastatin (LIPITOR) 10 MG tablet Take 10 mg by mouth daily.     . fenofibrate 160 MG tablet Take 145 mg by mouth at bedtime.     Marland Kitchen glyBURIDE-metformin (GLUCOVANCE) 5-500 MG per tablet Take 2 tablets by mouth 2 (two) times daily with a meal.    . hydroxychloroquine (PLAQUENIL) 200 MG tablet Take 200 mg daily by mouth.     Marland Kitchen JANUVIA 100 MG tablet Take 100 mg by mouth daily.     Javier Docker Oil 500 MG CAPS Take 500 mg by mouth daily.    Marland Kitchen levothyroxine (SYNTHROID, LEVOTHROID) 25 MCG tablet Take 25 mcg by mouth daily before breakfast.    . Liraglutide (VICTOZA) 18 MG/3ML SOPN Inject 1.8 mg into the skin every morning.    Marland Kitchen lisinopril (PRINIVIL,ZESTRIL) 5 MG tablet Take 5 mg by mouth every morning.    . magnesium oxide (MAG-OX) 400 MG tablet Take 400 mg by mouth daily.    . metoprolol succinate (TOPROL-XL) 50 MG 24 hr tablet Take 50 mg by mouth every morning. Take with or immediately following a meal.    . Milk Thistle 1000 MG CAPS Take 1,000 mg by mouth daily.     . niacin (NIASPAN) 500 MG CR tablet Take 500 mg by mouth at bedtime.    . niacin 500 MG tablet Take 500 mg by mouth every morning.    Marland Kitchen omeprazole (PRILOSEC) 20 MG capsule 1 PO 30 mins prior to breakfast and supper (Patient taking differently: Take 20 mg by mouth daily. 1 PO 30 mins prior to breakfast and supper) 180 capsule 3  . dicyclomine (BENTYL) 10 MG capsule Take 1 capsule (10 mg total) by mouth 4 (four) times daily as needed (for abdominal pain or diarrhea). 30 capsule 3   No current facility-administered medications for this visit.     Allergies as of 07/26/2018 - Review Complete 07/26/2018  Allergen Reaction  Noted  . Penicillins Hives 10/26/2010  . Doxycycline Rash 07/18/2017    Family History  Problem Relation Age of Onset  . Liver disease Mother   . Diabetes type II Mother   . Hypertension Mother   . Diabetes Mellitus II Father   . Hypertension Father   . Heart disease Father   . Colon cancer Neg Hx     Social History   Socioeconomic History  . Marital status: Married    Spouse name: Not on file  . Number of children: Not on file  . Years of education: Not on file  . Highest education level: Not on file  Occupational History  . Not on file  Social Needs  . Financial resource strain: Not on file  . Food insecurity:    Worry: Not on file    Inability: Not on file  . Transportation needs:    Medical: Not on file    Non-medical: Not on file  Tobacco Use  . Smoking status:  Current Every Day Smoker    Packs/day: 1.00    Years: 0.00    Pack years: 0.00    Types: Cigarettes  . Smokeless tobacco: Never Used  . Tobacco comment: one pack daily  Substance and Sexual Activity  . Alcohol use: Yes    Alcohol/week: 0.0 standard drinks    Comment: Very rarely: typically once every 6+ months; typically 1 drink per occurrence.  . Drug use: No  . Sexual activity: Not on file  Lifestyle  . Physical activity:    Days per week: Not on file    Minutes per session: Not on file  . Stress: Not on file  Relationships  . Social connections:    Talks on phone: Not on file    Gets together: Not on file    Attends religious service: Not on file    Active member of club or organization: Not on file    Attends meetings of clubs or organizations: Not on file    Relationship status: Not on file  Other Topics Concern  . Not on file  Social History Narrative  . Not on file    Review of Systems: Complete ROS negative except as per HPI.   Physical Exam: BP 111/73   Pulse 88   Temp (!) 97 F (36.1 C) (Oral)   Ht 5\' 3"  (1.6 m)   Wt 226 lb (102.5 kg)   BMI 40.03 kg/m  General:    Alert and oriented. Pleasant and cooperative. Well-nourished and well-developed.  Eyes:  Without icterus, sclera clear and conjunctiva pink.  Ears:  Normal auditory acuity. Cardiovascular:  S1, S2 present without murmurs appreciated. Extremities without clubbing or edema. Respiratory:  Clear to auscultation bilaterally. No wheezes, rales, or rhonchi. No distress.  Gastrointestinal:  +BS, soft, non-tender and non-distended. No HSM noted. No guarding or rebound. No masses appreciated.  Rectal:  Deferred  Musculoskalatal:  Symmetrical without gross deformities.  Neurologic:  Alert and oriented x4;  grossly normal neurologically. Psych:  Alert and cooperative. Normal mood and affect. Heme/Lymph/Immune: No excessive bruising noted.    07/26/2018 4:27 PM   Disclaimer: This note was dictated with voice recognition software. Similar sounding words can inadvertently be transcribed and may not be corrected upon review.

## 2018-07-26 NOTE — Addendum Note (Signed)
Addended by: Gordy Levan, ERIC A on: 07/26/2018 04:35 PM   Modules accepted: Orders

## 2018-07-26 NOTE — Assessment & Plan Note (Signed)
Grade 1 esophageal varices, EGD for variceal surveillance is up-to-date.  She is next due in about 3 years.  We will continue to monitor her platelet count for signs of worsening portal hypertension which could indicate a need for early interval surveillance.  Follow-up in 6 months.

## 2018-07-27 ENCOUNTER — Encounter: Payer: Self-pay | Admitting: Gastroenterology

## 2018-07-27 NOTE — Progress Notes (Signed)
CC'D TO PCP °

## 2018-07-28 ENCOUNTER — Other Ambulatory Visit (HOSPITAL_COMMUNITY): Payer: Self-pay | Admitting: Pulmonary Disease

## 2018-07-28 DIAGNOSIS — Z1231 Encounter for screening mammogram for malignant neoplasm of breast: Secondary | ICD-10-CM

## 2018-07-31 ENCOUNTER — Encounter (HOSPITAL_COMMUNITY): Payer: Self-pay

## 2018-07-31 ENCOUNTER — Ambulatory Visit (HOSPITAL_COMMUNITY)
Admission: RE | Admit: 2018-07-31 | Discharge: 2018-07-31 | Disposition: A | Discharge status: Home / Self Care | Payer: BC Managed Care – PPO | Source: Ambulatory Visit | Attending: Pulmonary Disease | Admitting: Pulmonary Disease

## 2018-07-31 DIAGNOSIS — Z1231 Encounter for screening mammogram for malignant neoplasm of breast: Secondary | ICD-10-CM | POA: Insufficient documentation

## 2018-08-01 ENCOUNTER — Ambulatory Visit (HOSPITAL_COMMUNITY)
Admission: RE | Admit: 2018-08-01 | Discharge: 2018-08-01 | Disposition: A | Discharge status: Home / Self Care | Payer: BC Managed Care – PPO | Source: Ambulatory Visit | Attending: Nurse Practitioner | Admitting: Nurse Practitioner

## 2018-08-01 DIAGNOSIS — I8510 Secondary esophageal varices without bleeding: Secondary | ICD-10-CM | POA: Diagnosis not present

## 2018-08-01 DIAGNOSIS — Z9049 Acquired absence of other specified parts of digestive tract: Secondary | ICD-10-CM | POA: Insufficient documentation

## 2018-08-01 DIAGNOSIS — K7460 Unspecified cirrhosis of liver: Secondary | ICD-10-CM

## 2018-08-01 DIAGNOSIS — R197 Diarrhea, unspecified: Secondary | ICD-10-CM

## 2018-08-01 LAB — AFP TUMOR MARKER: AFP, Serum, Tumor Marker: 2.5 ng/mL (ref 0.0–8.3)

## 2018-08-01 LAB — PROTIME-INR
INR: 1 (ref 0.8–1.2)
PROTHROMBIN TIME: 10.7 s (ref 9.1–12.0)

## 2018-08-16 ENCOUNTER — Other Ambulatory Visit: Payer: Self-pay | Admitting: Gastroenterology

## 2018-09-29 ENCOUNTER — Other Ambulatory Visit (HOSPITAL_COMMUNITY): Payer: Self-pay | Admitting: Pulmonary Disease

## 2018-09-29 ENCOUNTER — Ambulatory Visit (HOSPITAL_COMMUNITY)
Admission: RE | Admit: 2018-09-29 | Discharge: 2018-09-29 | Disposition: A | Discharge status: Home / Self Care | Payer: BC Managed Care – PPO | Source: Ambulatory Visit | Attending: Pulmonary Disease | Admitting: Pulmonary Disease

## 2018-09-29 DIAGNOSIS — R079 Chest pain, unspecified: Secondary | ICD-10-CM

## 2019-02-07 ENCOUNTER — Encounter: Payer: Self-pay | Admitting: Nurse Practitioner

## 2019-02-07 ENCOUNTER — Other Ambulatory Visit: Payer: Self-pay

## 2019-02-07 ENCOUNTER — Ambulatory Visit: Payer: BC Managed Care – PPO | Admitting: Nurse Practitioner

## 2019-02-07 VITALS — BP 116/73 | HR 90 | Temp 97.5°F | Ht 63.0 in | Wt 224.0 lb

## 2019-02-07 DIAGNOSIS — I8510 Secondary esophageal varices without bleeding: Secondary | ICD-10-CM

## 2019-02-07 DIAGNOSIS — K7460 Unspecified cirrhosis of liver: Secondary | ICD-10-CM | POA: Diagnosis not present

## 2019-02-07 DIAGNOSIS — R161 Splenomegaly, not elsewhere classified: Secondary | ICD-10-CM | POA: Diagnosis not present

## 2019-02-07 NOTE — Progress Notes (Signed)
Referring Provider: Sinda Du, MD Primary Care Physician:  Sinda Du, MD Primary GI:  Dr. Oneida Alar  Chief Complaint  Patient presents with  . Cirrhosis    f/u    HPI:   Chelsea Estrada is a 53 y.o. female who presents for follow-up on cirrhosis.  The patient was last seen in our office 07/26/2018 for the same as well as esophageal varices.  Previously having diarrhea likely related to stress from her quality passing.  Historically her liver disease has been well compensated with a meld score of about 6 and a child Pugh class A.  EGD up-to-date 2019 found nonobstructing Schatzki's ring, erosive gastritis due to aspirin, nonbleeding diverticulum.  Recommended repeat EGD in 3 years for surveillance of varices (2021).  Colonoscopy up-to-date 2019 and recommended 10-year repeat in 2029.  At her last visit she was having a bad case of psoriasis with more stress when her dog died unexpectedly at young age recently.  Denies overt hepatic symptoms.  Intermittent chronic diarrhea which is stress related.  No other GI complaints.  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  Started on Creon but she saw the difference.  Recommended Bentyl 10 mg 4 times a day as needed, labs (INR and AFP only as CBC and CMP has already been ordered by primary care), imaging, follow-up in 6 months.  Labs drawn 07/31/2018 which found normal INR, normal AFP.  We still need to request primary care labs to calculate a meld score.  Right upper quadrant ultrasound completed 08/01/2018 which found consistent with cirrhosis, no abnormal lesions, no ascites.  Today she states she's doing well overall. Denies abdominal pain, N/V, hematochezia, melena, fever, chills, unintentional weight loss. Denies yellowing of skin/eyes, darkened urine, acute episodic confusion, tremors, generalized pruritis. Denies URI or flu-like symptoms. Denies loss of sense of taste or smell. Denies chest pain, dyspnea,  dizziness, lightheadedness, syncope, near syncope. Denies any other upper or lower GI symptoms.  Did have some intermittent flares of Lupus in December 2019 necessitating increase in dose of Plaquenil. Has had a lot of stress which has caused intermittent GI symptoms, but doing well now.  Past Medical History:  Diagnosis Date  . Family history of adverse reaction to anesthesia    mother-- PONV  . Frequency of urination   . History of kidney stones   . Hypertension   . Hypothyroidism   . Lupus ([place])    effects joints--  rheumologist-  dr Amil Amen w/ [facility]  . Right ureteral stone   . Type 2 diabetes mellitus ([place])   . Urgency of urination   . Wears glasses     Past Surgical History:  Procedure Laterality Date  . BIOPSY  02/17/2018   Procedure: BIOPSY;  Surgeon: Danie Binder, MD;  [facility];  Service: Endoscopy;;  duodenum;  . CHOLECYSTECTOMY OPEN  age 69's  . COLONOSCOPY N/A 04/10/2018   Procedure: COLONOSCOPY;  Surgeon: Danie Binder, MD;  [facility];  Service: Endoscopy;  Laterality: N/A;  9:30  . CYSTOSCOPY WITH RETROGRADE PYELOGRAM, URETEROSCOPY AND STENT PLACEMENT Right 04/04/2015   Procedure: CYSTOSCOPY WITH RETROGRADE PYELOGRAM, URETEROSCOPY AND STENT PLACEMENT;  Surgeon: Alexis Frock, MD;  Location: [facility];  Service: Urology;  Laterality: Right;  . ESOPHAGOGASTRODUODENOSCOPY N/A 08/25/2015   Procedure: ESOPHAGOGASTRODUODENOSCOPY (EGD);  Surgeon: Danie Binder, MD;  [facility];  Service: Endoscopy;  Laterality: N/A;  830  . ESOPHAGOGASTRODUODENOSCOPY N/A 02/17/2018  Procedure: ESOPHAGOGASTRODUODENOSCOPY (EGD);  Surgeon: Danie Binder, MD;  [facility];  Service: Endoscopy;  Laterality: N/A;  10:15am  . HOLMIUM LASER APPLICATION Right 10/28/9796   Procedure: HOLMIUM LASER APPLICATION;  Surgeon: Alexis Frock, MD;  Location: [facility];  Service: Urology;  Laterality: Right;     Current Outpatient Medications  Medication Sig Dispense Refill  . acetaminophen (TYLENOL) 325 MG tablet Take 2 tablets (650 mg total) by mouth every 8 (eight) hours as needed for mild pain or moderate pain. NO MORE THAN 6 TABLETS A DAY 500 tablet 0  . atorvastatin (LIPITOR) 10 MG tablet Take 10 mg by mouth daily.     Marland Kitchen dicyclomine (BENTYL) 10 MG capsule Take 1 capsule (10 mg total) by mouth 4 (four) times daily as needed (for abdominal pain or diarrhea). 30 capsule 3  . fenofibrate 160 MG tablet Take 145 mg by mouth at bedtime.     Marland Kitchen glyBURIDE-metformin (GLUCOVANCE) 5-500 MG per tablet Take 2 tablets by mouth 2 (two) times daily with a meal.    . hydroxychloroquine (PLAQUENIL) 200 MG tablet Take 200 mg by mouth 2 (two) times daily.     Marland Kitchen JANUVIA 100 MG tablet Take 100 mg by mouth daily.     Javier Docker Oil 500 MG CAPS Take 500 mg by mouth daily.    Marland Kitchen levothyroxine (SYNTHROID, LEVOTHROID) 25 MCG tablet Take 25 mcg by mouth daily before breakfast.    . Liraglutide (VICTOZA) 18 MG/3ML SOPN Inject 1.8 mg into the skin every morning.    Marland Kitchen lisinopril (PRINIVIL,ZESTRIL) 5 MG tablet Take 5 mg by mouth every morning.    . magnesium oxide (MAG-OX) 400 MG tablet Take 400 mg by mouth daily.    . metoprolol succinate (TOPROL-XL) 50 MG 24 hr tablet Take 50 mg by mouth every morning. Take with or immediately following a meal.    . Milk Thistle 1000 MG CAPS Take 1,000 mg by mouth daily.     . niacin (NIASPAN) 500 MG CR tablet Take 500 mg by mouth at bedtime.    . niacin 500 MG tablet Take 500 mg by mouth every morning.    Marland Kitchen omeprazole (PRILOSEC) 20 MG capsule TAKE 1 CAPSULE 30 MINUTES  PRIOR TO BREAKFAST AND     SUPPER 180 capsule 3   No current facility-administered medications for this visit.     Allergies as of 02/07/2019 - Review Complete 02/07/2019  Allergen Reaction Noted  . Penicillins Hives 10/26/2010  . Doxycycline Rash 07/18/2017    Family History  Problem Relation Age of Onset  . Liver  disease Mother   . Diabetes type II Mother   . Hypertension Mother   . Diabetes Mellitus II Father   . Hypertension Father   . Heart disease Father   . Colon cancer Neg Hx     Social History   Socioeconomic History  . Marital status: Married    Spouse name: Not on file  . Number of children: Not on file  . Years of education: Not on file  . Highest education level: Not on file  Occupational History  . Not on file  Social Needs  . Financial resource strain: Not on file  . Food insecurity:    Worry: Not on file    Inability: Not on file  . Transportation needs:    Medical: Not on file    Non-medical: Not on file  Tobacco Use  . Smoking status: Current Every Day  Smoker    Packs/day: 1.00    Years: 0.00    Pack years: 0.00    Types: Cigarettes  . Smokeless tobacco: Never Used  . Tobacco comment: one pack daily  Substance and Sexual Activity  . Alcohol use: Yes    Alcohol/week: 0.0 standard drinks    Comment: Very rarely: typically once every 6+ months; typically 1 drink per occurrence.  . Drug use: No  . Sexual activity: Not on file  Lifestyle  . Physical activity:    Days per week: Not on file    Minutes per session: Not on file  . Stress: Not on file  Relationships  . Social connections:    Talks on phone: Not on file    Gets together: Not on file    Attends religious service: Not on file    Active member of club or organization: Not on file    Attends meetings of clubs or organizations: Not on file    Relationship status: Not on file  Other Topics Concern  . Not on file  Social History Narrative  . Not on file    Review of Systems: Complete ROS negative except as per HPI.   Physical Exam: BP 116/73   Pulse 90   Temp (!) 97.5 F (36.4 C) (Oral)   Ht 5\' 3"  (1.6 m)   Wt 224 lb (101.6 kg)   BMI 39.68 kg/m  General:   Alert and oriented. Pleasant and cooperative. Well-nourished and well-developed.  Head:  Normocephalic and atraumatic. Eyes:   Without icterus, sclera clear and conjunctiva pink.  Ears:  Normal auditory acuity. Mouth:  No deformity or lesions, oral mucosa pink.  Throat/Neck:  Supple, without mass or thyromegaly. Cardiovascular:  S1, S2 present without murmurs appreciated. Normal pulses noted. Extremities without clubbing or edema. Respiratory:  Clear to auscultation bilaterally. No wheezes, rales, or rhonchi. No distress.  Gastrointestinal:  +BS, soft, non-tender and non-distended. No HSM noted. No guarding or rebound. No masses appreciated.  Rectal:  Deferred  Musculoskalatal:  Symmetrical without gross deformities. Normal posture. Skin:  Intact without significant lesions or rashes. Neurologic:  Alert and oriented x4;  grossly normal neurologically. Psych:  Alert and cooperative. Normal mood and affect. Heme/Lymph/Immune: No significant cervical adenopathy. No excessive bruising noted.    02/07/2019 3:28 PM   Disclaimer: This note was dictated with voice recognition software. Similar sounding words can inadvertently be transcribed and may not be corrected upon review.

## 2019-02-07 NOTE — Assessment & Plan Note (Signed)
Splenomegaly likely related to cirrhosis and portal hypertension. No varices as per above. Will continue regular monitoring of labs including plt count. Labs due today. Follow-up in 6 months.

## 2019-02-07 NOTE — Assessment & Plan Note (Signed)
EGD up to date, no varices noted. On recall for 3 year screening.

## 2019-02-07 NOTE — Patient Instructions (Addendum)
Your health issues we discussed today were:   Cirrhosis: 1. Have your labs drawn when you can 2. We will help schedule your ultrasound for you 3. Notify us if you have any concerning symptoms  Overall I recommend:  1. Continue your other current medications 2. Follow-up in 6 months 3. Call us if you have any questions or concerns.   Because of recent events of COVID-19 ("Coronavirus"), follow CDC recommendations:  1. Wash your hand frequently 2. Avoid touching your face 3. Stay away from people who are sick 4. If you have symptoms such as fever, cough, shortness of breath then call your healthcare provider for further guidance 5. If you are sick, STAY AT HOME unless otherwise directed by your healthcare provider. 6. Follow directions from state and national officials regarding staying safe   At [facility] we value your feedback. You may receive a survey about your visit today. Please share your experience as we strive to create trusting relationships with our patients to provide genuine, compassionate, quality care.  We appreciate your understanding and patience as we review any laboratory studies, imaging, and other diagnostic tests that are ordered as we care for you. Our office policy is 5 business days for review of these results, and any emergent or urgent results are addressed in a timely manner for your best interest. If you do not hear from our office in 1 week, please contact us.   We also encourage the use of MyChart, which contains your medical information for your review as well. If you are not enrolled in this feature, an access code is on this after visit summary for your convenience. Thank you for allowing [place] to be involved in your care.  It was great to see you today!  I hope you have a great day!!

## 2019-02-07 NOTE — Assessment & Plan Note (Addendum)
History of cirrhosis, historically well compensated. Will request labs from PCP from 07/2019 to calculate MELD score. Is due for labs and imaging today. Patient declining U/S because of expense, agrees to yearly imaging. No overt GI or hepatic symptoms today. Follow-up in 6 months.

## 2019-02-16 ENCOUNTER — Other Ambulatory Visit: Payer: Self-pay | Admitting: Nurse Practitioner

## 2019-02-17 LAB — CBC WITH DIFFERENTIAL/PLATELET
Basophils Absolute: 0 10*3/uL (ref 0.0–0.2)
Basos: 1 %
EOS (ABSOLUTE): 0 10*3/uL (ref 0.0–0.4)
Eos: 0 %
Hematocrit: 34.8 % (ref 34.0–46.6)
Hemoglobin: 11.3 g/dL (ref 11.1–15.9)
Immature Grans (Abs): 0.1 10*3/uL (ref 0.0–0.1)
Immature Granulocytes: 1 %
Lymphocytes Absolute: 0.9 10*3/uL (ref 0.7–3.1)
Lymphs: 15 %
MCH: 27.7 pg (ref 26.6–33.0)
MCHC: 32.5 g/dL (ref 31.5–35.7)
MCV: 85 fL (ref 79–97)
Monocytes Absolute: 0.6 10*3/uL (ref 0.1–0.9)
Monocytes: 9 %
Neutrophils Absolute: 4.5 10*3/uL (ref 1.4–7.0)
Neutrophils: 74 %
Platelets: 118 10*3/uL — ABNORMAL LOW (ref 150–450)
RBC: 4.08 x10E6/uL (ref 3.77–5.28)
RDW: 14 % (ref 11.7–15.4)
WBC: 6.1 10*3/uL (ref 3.4–10.8)

## 2019-02-17 LAB — AFP, TUMOR MARKER (SERIAL): AFP, Serum, Tumor Marker: 2.5 ng/mL (ref 0.0–8.3)

## 2019-02-17 LAB — COMPREHENSIVE METABOLIC PANEL
ALT: 8 IU/L (ref 0–32)
AST: 16 IU/L (ref 0–40)
Albumin/Globulin Ratio: 1.5 (ref 1.2–2.2)
Albumin: 4.3 g/dL (ref 3.8–4.9)
Alkaline Phosphatase: 99 IU/L (ref 39–117)
BUN/Creatinine Ratio: 16 (ref 9–23)
BUN: 12 mg/dL (ref 6–24)
Bilirubin Total: 0.4 mg/dL (ref 0.0–1.2)
CO2: 24 mmol/L (ref 20–29)
Calcium: 9.4 mg/dL (ref 8.7–10.2)
Chloride: 100 mmol/L (ref 96–106)
Creatinine, Ser: 0.73 mg/dL (ref 0.57–1.00)
GFR calc Af Amer: 110 mL/min/{1.73_m2} (ref >59)
GFR calc non Af Amer: 95 mL/min/{1.73_m2} (ref >59)
Globulin, Total: 2.9 g/dL (ref 1.5–4.5)
Glucose: 294 mg/dL — ABNORMAL HIGH (ref 65–99)
Potassium: 5 mmol/L (ref 3.5–5.2)
Sodium: 137 mmol/L (ref 134–144)
Total Protein: 7.2 g/dL (ref 6.0–8.5)

## 2019-02-17 LAB — PROTIME-INR
INR: 1 (ref 0.8–1.2)
Prothrombin Time: 10.8 s (ref 9.1–12.0)

## 2019-05-03 ENCOUNTER — Ambulatory Visit (INDEPENDENT_AMBULATORY_CARE_PROVIDER_SITE_OTHER): Payer: BC Managed Care – PPO

## 2019-05-03 ENCOUNTER — Other Ambulatory Visit: Payer: Self-pay

## 2019-05-03 ENCOUNTER — Other Ambulatory Visit: Payer: Self-pay | Admitting: Podiatry

## 2019-05-03 ENCOUNTER — Ambulatory Visit: Payer: BC Managed Care – PPO | Admitting: Podiatry

## 2019-05-03 ENCOUNTER — Encounter: Payer: Self-pay | Admitting: Podiatry

## 2019-05-03 VITALS — BP 119/73 | HR 88

## 2019-05-03 DIAGNOSIS — D3613 Benign neoplasm of peripheral nerves and autonomic nervous system of lower limb, including hip: Secondary | ICD-10-CM

## 2019-05-03 DIAGNOSIS — M7750 Other enthesopathy of unspecified foot: Secondary | ICD-10-CM

## 2019-05-03 DIAGNOSIS — G5761 Lesion of plantar nerve, right lower limb: Secondary | ICD-10-CM

## 2019-05-03 DIAGNOSIS — G5781 Other specified mononeuropathies of right lower limb: Secondary | ICD-10-CM

## 2019-05-05 ENCOUNTER — Encounter: Payer: Self-pay | Admitting: Podiatry

## 2019-05-05 NOTE — Progress Notes (Signed)
Subjective:  Patient ID: Chelsea Estrada, female    DOB: 05-May-1966,  MRN: [id number] HPI Chief Complaint  Patient presents with  . Tendonitis    right foot numbness since 3 months,3rd,4th, 5th toes    53 y.o. female presents with the above complaint.   ROS: Denies fever chills nausea vomiting muscle aches pains calf pain back pain chest pain shortness of breath.  Past Medical History:  Diagnosis Date  . Family history of adverse reaction to anesthesia    mother-- PONV  . Frequency of urination   . History of kidney stones   . Hypertension   . Hypothyroidism   . Lupus ([place])    effects joints--  rheumologist-  dr Amil Amen w/ [facility]  . Right ureteral stone   . Type 2 diabetes mellitus ([place])   . Urgency of urination   . Wears glasses    Past Surgical History:  Procedure Laterality Date  . BIOPSY  02/17/2018   Procedure: BIOPSY;  Surgeon: Danie Binder, MD;  [facility];  Service: Endoscopy;;  duodenum;  . CHOLECYSTECTOMY OPEN  age 37's  . COLONOSCOPY N/A 04/10/2018   Procedure: COLONOSCOPY;  Surgeon: Danie Binder, MD;  [facility];  Service: Endoscopy;  Laterality: N/A;  9:30  . CYSTOSCOPY WITH RETROGRADE PYELOGRAM, URETEROSCOPY AND STENT PLACEMENT Right 04/04/2015   Procedure: CYSTOSCOPY WITH RETROGRADE PYELOGRAM, URETEROSCOPY AND STENT PLACEMENT;  Surgeon: Alexis Frock, MD;  Location: [facility];  Service: Urology;  Laterality: Right;  . ESOPHAGOGASTRODUODENOSCOPY N/A 08/25/2015   Procedure: ESOPHAGOGASTRODUODENOSCOPY (EGD);  Surgeon: Danie Binder, MD;  [facility];  Service: Endoscopy;  Laterality: N/A;  830  . ESOPHAGOGASTRODUODENOSCOPY N/A 02/17/2018   Procedure: ESOPHAGOGASTRODUODENOSCOPY (EGD);  Surgeon: Danie Binder, MD;  [facility];  Service: Endoscopy;  Laterality: N/A;  10:15am  . HOLMIUM LASER APPLICATION Right [id number]   Procedure: HOLMIUM LASER APPLICATION;  Surgeon: Alexis Frock, MD;  Location: [facility];  Service: Urology;  Laterality: Right;    Current Outpatient Medications:  .  acetaminophen (TYLENOL) 325 MG tablet, Take 2 tablets (650 mg total) by mouth every 8 (eight) hours as needed for mild pain or moderate pain. NO MORE THAN 6 TABLETS A DAY, Disp: 500 tablet, Rfl: 0 .  atorvastatin (LIPITOR) 10 MG tablet, Take 10 mg by mouth daily. , Disp: , Rfl:  .  dicyclomine (BENTYL) 10 MG capsule, Take 1 capsule (10 mg total) by mouth 4 (four) times daily as needed (for abdominal pain or diarrhea)., Disp: 30 capsule, Rfl: 3 .  EPINEPHrine 0.3 mg/0.3 mL IJ SOAJ injection, , Disp: , Rfl:  .  fenofibrate 160 MG tablet, Take 145 mg by mouth at bedtime. , Disp: , Rfl:  .  glyBURIDE-metformin (GLUCOVANCE) 5-500 MG per tablet, Take 2 tablets by mouth 2 (two) times daily with a meal., Disp: , Rfl:  .  hydroxychloroquine (PLAQUENIL) 200 MG tablet, Take 200 mg by mouth 2 (two) times daily. , Disp: , Rfl:  .  JANUVIA 100 MG tablet, Take 100 mg by mouth daily. , Disp: , Rfl:  .  Krill Oil 500 MG CAPS, Take 500 mg by mouth daily., Disp: , Rfl:  .  levothyroxine (SYNTHROID, LEVOTHROID) 25 MCG tablet, Take 25 mcg by mouth daily before breakfast., Disp: , Rfl:  .  Liraglutide (VICTOZA) 18 MG/3ML SOPN, Inject 1.8 mg into the skin every morning., Disp: , Rfl:  .  lisinopril (PRINIVIL,ZESTRIL) 5  MG tablet, Take 5 mg by mouth every morning., Disp: , Rfl:  .  magnesium oxide (MAG-OX) 400 MG tablet, Take 400 mg by mouth daily., Disp: , Rfl:  .  metoprolol succinate (TOPROL-XL) 50 MG 24 hr tablet, Take 50 mg by mouth every morning. Take with or immediately following a meal., Disp: , Rfl:  .  Milk Thistle 1000 MG CAPS, Take 1,000 mg by mouth daily. , Disp: , Rfl:  .  niacin (NIASPAN) 500 MG CR tablet, Take 500 mg by mouth at bedtime., Disp: , Rfl:  .  niacin 500 MG tablet, Take 500 mg by mouth every morning., Disp: , Rfl:  .  omeprazole (PRILOSEC) 20 MG capsule, TAKE 1  CAPSULE 30 MINUTES  PRIOR TO BREAKFAST AND     SUPPER, Disp: 180 capsule, Rfl: 3 .  OneTouch Delica Lancets [id number] MISC, , Disp: , Rfl:  .  ONETOUCH VERIO test strip, , Disp: , Rfl:   Allergies  Allergen Reactions  . Penicillins Hives    Childhood Allergy Has patient had a PCN reaction causing immediate rash, facial/tongue/throat swelling, SOB or lightheadedness with hypotension: No Has patient had a PCN reaction causing severe rash involving mucus membranes or skin necrosis: No Has patient had a PCN reaction that required hospitalization: No Has patient had a PCN reaction occurring within the last 10 years: No If all of the above answers are "NO", then may proceed with Cephalosporin use.   Marland Kitchen Doxycycline Rash    Vaginal rash   Review of Systems Objective:   Vitals:   05/03/19 1459  BP: 119/73  Pulse: 88    General: Well developed, nourished, in no acute distress, alert and oriented x3   Dermatological: Skin is warm, dry and supple bilateral. Nails x 10 are well maintained; remaining integument appears unremarkable at this time. There are no open sores, no preulcerative lesions, no rash or signs of infection present.  Vascular: Dorsalis Pedis artery and Posterior Tibial artery pedal pulses are 2/4 bilateral with immedate capillary fill time. Pedal hair growth present. No varicosities and no lower extremity edema present bilateral.   Neruologic: Grossly intact via light touch bilateral. Vibratory intact via tuning fork bilateral. Protective threshold with Semmes Wienstein monofilament intact to all pedal sites bilateral. Patellar and Achilles deep tendon reflexes 2+ bilateral. No Babinski or clonus noted bilateral.   Musculoskeletal: No gross boney pedal deformities bilateral. No pain, crepitus, or limitation noted with foot and ankle range of motion bilateral. Muscular strength 5/5 in all groups tested bilateral.  Gait: Unassisted, Nonantalgic.    Radiographs:  Do not demonstrate  any type of acute abnormalities.  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  Discussed icing.  I also injected 10 mg of Kenalog to the third and fourth interdigital spaces of the right foot through 2 different injection sites.  This was performed after Betadine skin prep.  She tolerated procedure well without complications.  Follow-up with her in a few weeks.  May need to consider back work-up if this fails to alleviate symptoms.     Max T. [place], [place]

## 2019-05-31 ENCOUNTER — Ambulatory Visit: Payer: BC Managed Care – PPO | Admitting: Podiatry

## 2019-05-31 ENCOUNTER — Other Ambulatory Visit: Payer: Self-pay

## 2019-05-31 ENCOUNTER — Encounter: Payer: Self-pay | Admitting: Podiatry

## 2019-05-31 DIAGNOSIS — G5781 Other specified mononeuropathies of right lower limb: Secondary | ICD-10-CM

## 2019-05-31 DIAGNOSIS — G5761 Lesion of plantar nerve, right lower limb: Secondary | ICD-10-CM

## 2019-05-31 NOTE — Progress Notes (Signed)
She presents today for follow-up of her neuroma to the third interspace of the right foot.  States that it felt better for a few days but has not felt any better since she also states that her mother had a seizure type activity recently and that her niece experienced a ruptured appendix.  Objective: Vital signs are stable alert oriented x3 still with a palpable Mulder's click third interspace right foot pulses remain palpable.  Assessment: Neuroma third interspace right foot.  Plan: Introduced her first injection of dehydrated alcohol today a total of 2 cc of a 4% dehydrated alcohol with local anesthetic was injected after sterile Betadine skin prep to the dorsal aspect of the third interdigital space right foot.  Follow-up with her in 1 month.

## 2019-06-21 ENCOUNTER — Ambulatory Visit: Payer: BC Managed Care – PPO | Admitting: Podiatry

## 2019-06-21 ENCOUNTER — Other Ambulatory Visit: Payer: Self-pay

## 2019-06-21 ENCOUNTER — Encounter: Payer: Self-pay | Admitting: Podiatry

## 2019-06-21 DIAGNOSIS — D3613 Benign neoplasm of peripheral nerves and autonomic nervous system of lower limb, including hip: Secondary | ICD-10-CM

## 2019-06-21 DIAGNOSIS — G5761 Lesion of plantar nerve, right lower limb: Secondary | ICD-10-CM | POA: Diagnosis not present

## 2019-06-21 DIAGNOSIS — G5781 Other specified mononeuropathies of right lower limb: Secondary | ICD-10-CM

## 2019-06-21 NOTE — Progress Notes (Signed)
She presents today for follow-up of neuropathy third interdigital space of the right foot and states that her foot is feeling better.  Objective: Vital signs are stable she is alert and oriented x3.  Pulses are palpable.  She has a palpable Mulder's click third interspace of the right foot with minimal tenderness.  Assessment: Neuroma third interspace right foot resolving.  Plan: Went ahead and reinjected with 2 cc of 4% dehydrated alcohol and local anesthetic.  This was her second injection and follow-up with her in 1 month.

## 2019-07-13 ENCOUNTER — Other Ambulatory Visit: Payer: Self-pay | Admitting: Gastroenterology

## 2019-07-19 ENCOUNTER — Ambulatory Visit: Payer: BC Managed Care – PPO | Admitting: Podiatry

## 2019-08-10 NOTE — Progress Notes (Signed)
REVIEWED-NO ADDITIONAL RECOMMENDATIONS. 

## 2019-08-14 ENCOUNTER — Ambulatory Visit: Payer: BC Managed Care – PPO | Admitting: Podiatry

## 2019-08-21 ENCOUNTER — Other Ambulatory Visit: Payer: Self-pay

## 2019-08-21 ENCOUNTER — Encounter: Payer: Self-pay | Admitting: Podiatry

## 2019-08-21 ENCOUNTER — Ambulatory Visit: Payer: BC Managed Care – PPO | Admitting: Podiatry

## 2019-08-21 DIAGNOSIS — G5761 Lesion of plantar nerve, right lower limb: Secondary | ICD-10-CM | POA: Diagnosis not present

## 2019-08-21 DIAGNOSIS — G5781 Other specified mononeuropathies of right lower limb: Secondary | ICD-10-CM

## 2019-08-22 NOTE — Progress Notes (Signed)
She presents today for follow-up of neuroma third interspace of the right foot states that is been doing pretty good still little bit tender but all in all is feeling much better.  Objective: Vital signs are stable she is alert oriented x3 she has palpable Mulder's click third interdigital space of the right foot though much decrease in symptoms from previous evaluations.  Assessment: Resolving neuroma third interspace right.  Plan: Discussed etiology pathology and surgical therapies at this point time went ahead and reinjected third dehydrated alcohol dose right foot.  I will follow-up with her in 1 month

## 2019-09-18 ENCOUNTER — Ambulatory Visit: Payer: BC Managed Care – PPO | Admitting: Podiatry

## 2019-09-18 ENCOUNTER — Other Ambulatory Visit: Payer: Self-pay

## 2019-09-18 ENCOUNTER — Encounter: Payer: Self-pay | Admitting: Podiatry

## 2019-09-18 DIAGNOSIS — G5761 Lesion of plantar nerve, right lower limb: Secondary | ICD-10-CM | POA: Diagnosis not present

## 2019-09-18 DIAGNOSIS — G5781 Other specified mononeuropathies of right lower limb: Secondary | ICD-10-CM

## 2019-09-19 NOTE — Progress Notes (Signed)
She presents today for follow-up of neuroma third interspace of the right foot.  States that is doing okay but I think I need another injection.  Objective: Vital signs are stable alert oriented x3.  Pulses are palpable.  He has pain to the third interdigital space of the right foot.  Assessment: Pain in limb secondary to neuroma third interspace right.  Plan: Went ahead and injected the area today with dehydrated alcohol.  Follow-up with her in 3 weeks.

## 2019-10-09 ENCOUNTER — Ambulatory Visit: Payer: BC Managed Care – PPO | Admitting: Podiatry

## 2020-08-12 ENCOUNTER — Encounter: Payer: Self-pay | Admitting: Internal Medicine

## 2020-10-02 ENCOUNTER — Other Ambulatory Visit: Payer: Self-pay

## 2020-10-02 ENCOUNTER — Other Ambulatory Visit (HOSPITAL_COMMUNITY): Payer: Self-pay

## 2020-10-02 ENCOUNTER — Encounter: Payer: Self-pay | Admitting: *Deleted

## 2020-10-02 ENCOUNTER — Encounter: Payer: Self-pay | Admitting: Nurse Practitioner

## 2020-10-02 ENCOUNTER — Ambulatory Visit (HOSPITAL_COMMUNITY)
Admission: RE | Admit: 2020-10-02 | Discharge: 2020-10-02 | Disposition: A | Discharge status: Home / Self Care | Payer: BC Managed Care – PPO | Source: Ambulatory Visit | Attending: Internal Medicine | Admitting: Internal Medicine

## 2020-10-02 ENCOUNTER — Ambulatory Visit: Payer: BC Managed Care – PPO | Admitting: Nurse Practitioner

## 2020-10-02 VITALS — BP 137/74 | HR 88 | Temp 97.2°F | Ht 63.0 in | Wt 231.8 lb

## 2020-10-02 DIAGNOSIS — I8511 Secondary esophageal varices with bleeding: Secondary | ICD-10-CM | POA: Diagnosis not present

## 2020-10-02 DIAGNOSIS — R161 Splenomegaly, not elsewhere classified: Secondary | ICD-10-CM

## 2020-10-02 DIAGNOSIS — K7460 Unspecified cirrhosis of liver: Secondary | ICD-10-CM

## 2020-10-02 DIAGNOSIS — R0602 Shortness of breath: Secondary | ICD-10-CM

## 2020-10-02 DIAGNOSIS — R197 Diarrhea, unspecified: Secondary | ICD-10-CM

## 2020-10-02 DIAGNOSIS — I8510 Secondary esophageal varices without bleeding: Secondary | ICD-10-CM

## 2020-10-02 NOTE — Progress Notes (Signed)
Referring Provider: Celene Squibb, MD Primary Care Physician:  Celene Squibb, MD Primary GI:  Dr. Abbey Chatters  Chief Complaint  Patient presents with  . Cirrhosis    F/u. Due for [place] last done 2019  . Diarrhea    Constant all the time    HPI:   Chelsea Estrada is a 55 y.o. female who presents on referral from primary care for IBS symptoms.  The patient was last seen in our office 02/07/2019 for secondary esophageal varices, cirrhosis, splenomegaly.  Previously noted diarrhea likely stress-induced.  Historically liver disease well compensated with a meld of 6 child Pugh class A.  EGD found in 2019 gastritis due to aspirin and recommended repeat in 2021.  Colonoscopy up-to-date 2019 recommended 10-year repeat in 2029.  Also with a history of psoriasis.  Last right upper quadrant ultrasound 08/01/2018 consistent with cirrhosis.  At her last visit she was doing well overall, no overt hepatic or GI symptoms.  Noted intermittent flares of lupus in December 2019 necessitating increasing dose of Plaquenil.  A lot of stress recently had caused GI symptoms intermittently but doing overall okay at the time of her visit.  Recommended update cirrhosis labs and imaging, follow-up in 6 months.  Labs are completed 02/16/2019 which found normal CBC other than platelet count mildly suppressed at 118.  CMP with hyperglycemia and glucose 294, otherwise normal.  aFP tumor marker normal.  INR normal at 1.0.  Meld score 6, child Pugh class A.  Right upper quadrant ultrasound was not completed as recommended.  Today she states she doing okay overall. Diarrhea worsening in diarrhea in the setting of stress. At that time DM medications were changed (thought to possibly be contributing to diarrhea). She feels this has helped. However, still with diarrhea. Currently having 4 loose stools a day, sometimes better depending on dietary indiscretions. Seems overall better. No significant abdominal pain associated with this. Denies  N/V. Also thinks some improvement with being taken out of work (less stress). Was previously on Bentyl 10mg  qid which did does help. Only takes it if it's "really bad." Still feels foods not digesting all the way. Has low Vitamin D supplement. Denies hematochezia, melena. Denies any yellowing of the skin/eyes, darkened urine, acute episodic confusion, generalized pruritis. About a year ago she had some "jerking" and was told she had myoclonic jerks and started on Tizanadine which has helped. Denies chest pain, dyspnea, dizziness, lightheadedness, syncope, near syncope. Denies any other upper or lower GI symptoms.  Past Medical History:  Diagnosis Date  . Family history of adverse reaction to anesthesia    mother-- PONV  . Frequency of urination   . History of kidney stones   . Hypertension   . Hypothyroidism   . Lupus ([place])    effects joints--  rheumologist-  dr Amil Amen w/ [facility]  . Right ureteral stone   . Type 2 diabetes mellitus ([place])   . Urgency of urination   . Wears glasses     Past Surgical History:  Procedure Laterality Date  . BIOPSY  02/17/2018   Procedure: BIOPSY;  Surgeon: Danie Binder, MD;  [facility]: [facility];  Service: Endoscopy;;  duodenum;  . CHOLECYSTECTOMY OPEN  age 32's  . COLONOSCOPY N/A 04/10/2018   Procedure: COLONOSCOPY;  Surgeon: Danie Binder, MD;  Location: [facility];  Service: Endoscopy;  Laterality: N/A;  9:30  . CYSTOSCOPY WITH RETROGRADE PYELOGRAM, URETEROSCOPY AND STENT PLACEMENT Right 04/04/2015   Procedure:  CYSTOSCOPY WITH RETROGRADE PYELOGRAM, URETEROSCOPY AND STENT PLACEMENT;  Surgeon: Alexis Frock, MD;  Location: [facility];  Service: Urology;  Laterality: Right;  . ESOPHAGOGASTRODUODENOSCOPY N/A 08/25/2015   Procedure: ESOPHAGOGASTRODUODENOSCOPY (EGD);  Surgeon: Danie Binder, MD;  [facility]: [facility];  Service: Endoscopy;  Laterality: N/A;  830  . ESOPHAGOGASTRODUODENOSCOPY N/A 02/17/2018   Procedure:  ESOPHAGOGASTRODUODENOSCOPY (EGD);  Surgeon: Danie Binder, MD;  [facility]: [facility];  Service: Endoscopy;  Laterality: N/A;  10:15am  . HOLMIUM LASER APPLICATION Right [id number]   Procedure: HOLMIUM LASER APPLICATION;  Surgeon: Alexis Frock, MD;  Location: [facility];  Service: Urology;  Laterality: Right;    Current Outpatient Medications  Medication Sig Dispense Refill  . acetaminophen (TYLENOL) 325 MG tablet Take 2 tablets (650 mg total) by mouth every 8 (eight) hours as needed for mild pain or moderate pain. NO MORE THAN 6 TABLETS A DAY 500 tablet 0  . atorvastatin (LIPITOR) 10 MG tablet Take 10 mg by mouth daily.     Marland Kitchen dicyclomine (BENTYL) 10 MG capsule Take 1 capsule (10 mg total) by mouth 4 (four) times daily as needed (for abdominal pain or diarrhea). 30 capsule 3  . EPINEPHrine 0.3 mg/0.3 mL IJ SOAJ injection as needed.    . fenofibrate 160 MG tablet Take 145 mg by mouth at bedtime.     . furosemide (LASIX) 20 MG tablet Take 20 mg by mouth daily. X 14 days to help with fluid    . HYDROcodone-acetaminophen (NORCO/VICODIN) 5-325 MG tablet Take 1 tablet by mouth every 6 (six) hours as needed for moderate pain. For lupus    . hydroxychloroquine (PLAQUENIL) 200 MG tablet Take 200 mg by mouth 2 (two) times daily.     Javier Docker Oil 500 MG CAPS Take 500 mg by mouth in the morning and at bedtime.    . lactobacillus acidophilus (BACID) TABS tablet Take 1 tablet by mouth daily.    Marland Kitchen levothyroxine (SYNTHROID, LEVOTHROID) 25 MCG tablet Take 25 mcg by mouth daily before breakfast.    . lisinopril (PRINIVIL,ZESTRIL) 5 MG tablet Take 5 mg by mouth every morning.    . magnesium oxide (MAG-OX) 400 MG tablet Take 400 mg by mouth daily.    . metFORMIN (GLUCOPHAGE) 500 MG tablet Take 1,000 mg by mouth 2 (two) times daily with a meal.    . methocarbamol (ROBAXIN) 500 MG tablet Take 500 mg by mouth every 12 (twelve) hours.    . metoprolol succinate (TOPROL-XL) 50 MG 24 hr tablet Take 50  mg by mouth every morning. Take with or immediately following a meal.    . Milk Thistle 1000 MG CAPS Take 1,000 mg by mouth daily.     . multivitamin (VIT W/EXTRA C) CHEW chewable tablet Chew 1 tablet by mouth daily.    Marland Kitchen omeprazole (PRILOSEC) 20 MG capsule TAKE 1 CAPSULE 30 MINUTES  PRIOR TO BREAKFAST AND     SUPPER (Patient taking differently: Take by mouth daily.) 180 capsule 3  . potassium chloride SA (KLOR-CON) 20 MEQ tablet Take 20 mEq by mouth daily. X 14 days with lasix    . tiZANidine (ZANAFLEX) 4 MG tablet Take 4 mg by mouth at bedtime.    Marland Kitchen TREMFYA 100 MG/ML SOPN Inject into the skin.     No current facility-administered medications for this visit.    Allergies as of 10/02/2020 - Review Complete 10/02/2020  Allergen Reaction Noted  . Penicillins Hives 10/26/2010  . Doxycycline  Rash 07/18/2017  . Sulfamethoxazole-trimethoprim Other (See Comments) and Rash 09/18/2019    Family History  Problem Relation Age of Onset  . Liver disease Mother   . Diabetes type II Mother   . Hypertension Mother   . Diabetes Mellitus II Father   . Hypertension Father   . Heart disease Father   . Colon cancer Neg Hx     Social History   Socioeconomic History  . Marital status: Married    Spouse name: Not on file  . Number of children: Not on file  . Years of education: Not on file  . Highest education level: Not on file  Occupational History  . Not on file  Tobacco Use  . Smoking status: Current Every Day Smoker    Packs/day: 1.00    Years: 0.00    Pack years: 0.00    Types: Cigarettes  . Smokeless tobacco: Never Used  . Tobacco comment: one pack daily  Substance and Sexual Activity  . Alcohol use: Yes    Alcohol/week: 0.0 standard drinks    Comment: Very rarely: typically once every 6+ months; typically 1 drink per occurrence.  . Drug use: No  . Sexual activity: Not on file  Other Topics Concern  . Not on file  Social History Narrative  . Not on file   Social Determinants  of Health   Financial Resource Strain: Not on file  Food Insecurity: Not on file  Transportation Needs: Not on file  Physical Activity: Not on file  Stress: Not on file  Social Connections: Not on file    Subjective: Review of Systems  Constitutional: Negative for chills, fever, malaise/fatigue and weight loss.  HENT: Negative for congestion and sore throat.   Respiratory: Negative for cough and shortness of breath.   Cardiovascular: Negative for chest pain and palpitations.  Gastrointestinal: Positive for abdominal pain and diarrhea. Negative for blood in stool, heartburn, melena, nausea and vomiting.  Musculoskeletal: Negative for joint pain and myalgias.  Skin: Negative for rash.  Neurological: Negative for dizziness and weakness.  Endo/Heme/Allergies: Does not bruise/bleed easily.  Psychiatric/Behavioral: Negative for depression. The patient is not nervous/anxious.   All other systems reviewed and are negative.    Objective: BP 137/74   Pulse 88   Temp (!) 97.2 F (36.2 C)   Ht 5\' 3"  (1.6 m)   Wt 231 lb 12.8 oz (105.1 kg)   BMI 41.06 kg/m  Physical Exam Vitals and nursing note reviewed.  Constitutional:      General: She is not in acute distress.    Appearance: Normal appearance. She is well-developed. She is obese. She is not ill-appearing, toxic-appearing or diaphoretic.  HENT:     Head: Normocephalic and atraumatic.     Nose: No congestion or rhinorrhea.  Eyes:     General: No scleral icterus. Cardiovascular:     Rate and Rhythm: Normal rate and regular rhythm.     Heart sounds: Normal heart sounds.  Pulmonary:     Effort: Pulmonary effort is normal. No respiratory distress.     Breath sounds: Normal breath sounds.  Abdominal:     General: Bowel sounds are normal.     Palpations: Abdomen is soft. There is no hepatomegaly, splenomegaly or mass.     Tenderness: There is no abdominal tenderness. There is no guarding or rebound.     Hernia: No hernia is  present.  Skin:    General: Skin is warm and dry.     Coloration:  Skin is not jaundiced.     Findings: No rash.  Neurological:     General: No focal deficit present.     Mental Status: She is alert and oriented to person, place, and time.  Psychiatric:        Attention and Perception: Attention normal.        Mood and Affect: Mood normal.        Speech: Speech normal.        Behavior: Behavior normal.        Thought Content: Thought content normal.        Cognition and Memory: Cognition and memory normal.      Assessment:  Pleasant 55 year old female well-known to me who was found to the cracks related to cirrhosis care.  She presents for follow-up on cirrhosis to get back on schedule as well as to discuss IBS symptoms.  Cirrhosis with well compensated disease: She does have a history of esophageal varices.  However, her last EGD did not really find these.  Historically she has had a meld score around 6.  Likely fatty liver disease as her etiology.  No overt hepatic symptoms.  She is due for updated labs and imaging which we will schedule today.  Follow-up in 6 months for this, I emphasized the need for regular liver care.  IBS with diarrhea: Likely significant stress component.  She has done better since changing up some of her diabetes medications and being taken out of work.  She does still have up to 4 stools a day.  She avoid using Bentyl unless it is a terrible day.  I recommended she take Bentyl 1-2 times a day to see if this can help decrease her number of loose stools.  She can take additional doses as needed, opted for Prolixin today.  Call for worsening or severe symptoms.  No red flag signs today.  No hematochezia or melena.  No severe/significant abdominal pain.   Plan: 1. Use Bentyl more regularly to help control diarrhea 2. Update liver labs including CBC, CMP, AFP, INR 3. Right upper quadrant ultrasound 4. Call for worsening symptoms 5. Follow-up in 6  months    Thank you for allowing [place] to participate in the care of [place], DNP, AGNP-C Adult & Gerontological Nurse Practitioner [facility]   10/02/2020 9:24 AM   Disclaimer: This note was dictated with voice recognition software. Similar sounding words can inadvertently be transcribed and may not be corrected upon review.

## 2020-10-02 NOTE — Patient Instructions (Addendum)
Your health issues we discussed today were:   Cirrhosis:  1. Have your labs completed when you are able to.  You can bring the lab slips to your primary care and have them drawn with their labs 2. Asked their office to fax [place] the results 3. We will help schedule your ultrasound for you 4. Call us if you have any concerning symptoms  Irritable bowel syndrome with diarrhea (IBS-D): 1. I am sorry you are still having loose stools, but I am glad that they are better. 2. As we discussed, you can take Bentyl 1-2 times a day, depending on how you are doing 3. You can take additional doses of Bentyl up to a maximum 4 total doses in a day to help manage your diarrhea 4. Call for any worsening or severe symptoms  Overall I recommend:  1. Continue other current medications 2. Return for follow-up in 6 months 3. Call us for any questions or concerns   ---------------------------------------------------------------  At [facility] we value your feedback. You may receive a survey about your visit today. Please share your experience as we strive to create trusting relationships with our patients to provide genuine, compassionate, quality care.  We appreciate your understanding and patience as we review any laboratory studies, imaging, and other diagnostic tests that are ordered as we care for you. Our office policy is 5 business days for review of these results, and any emergent or urgent results are addressed in a timely manner for your best interest. If you do not hear from our office in 1 week, please contact us.   We also encourage the use of MyChart, which contains your medical information for your review as well. If you are not enrolled in this feature, an access code is on this after visit summary for your convenience. Thank you for allowing [place] to be involved in your care.  It was great to see you today!  I hope you have a safe and warm  winter!!    ---------------------------------------------------------------

## 2020-10-09 ENCOUNTER — Ambulatory Visit (HOSPITAL_COMMUNITY)
Admission: RE | Admit: 2020-10-09 | Discharge: 2020-10-09 | Disposition: A | Discharge status: Home / Self Care | Payer: BC Managed Care – PPO | Source: Ambulatory Visit | Attending: Nurse Practitioner | Admitting: Nurse Practitioner

## 2020-10-09 ENCOUNTER — Other Ambulatory Visit: Payer: Self-pay

## 2020-10-09 DIAGNOSIS — I8510 Secondary esophageal varices without bleeding: Secondary | ICD-10-CM | POA: Insufficient documentation

## 2020-10-09 DIAGNOSIS — R197 Diarrhea, unspecified: Secondary | ICD-10-CM | POA: Diagnosis present

## 2020-10-09 DIAGNOSIS — K7460 Unspecified cirrhosis of liver: Secondary | ICD-10-CM | POA: Insufficient documentation

## 2020-10-09 DIAGNOSIS — R161 Splenomegaly, not elsewhere classified: Secondary | ICD-10-CM | POA: Insufficient documentation

## 2020-10-20 NOTE — Patient Instructions (Signed)
CBC, CMP, PT, AFP serum tumor marker placed on [facility].

## 2020-10-21 ENCOUNTER — Ambulatory Visit (HOSPITAL_COMMUNITY)
Admission: RE | Admit: 2020-10-21 | Discharge: 2020-10-21 | Disposition: A | Discharge status: Home / Self Care | Payer: BC Managed Care – PPO | Source: Ambulatory Visit | Attending: Internal Medicine | Admitting: Internal Medicine

## 2020-10-21 ENCOUNTER — Other Ambulatory Visit (HOSPITAL_COMMUNITY): Payer: Self-pay | Admitting: Internal Medicine

## 2020-10-21 ENCOUNTER — Other Ambulatory Visit: Payer: Self-pay

## 2020-10-21 DIAGNOSIS — R0602 Shortness of breath: Secondary | ICD-10-CM

## 2020-10-22 NOTE — Progress Notes (Signed)
Phoned and advised the pt of all her results and was advised by her that her PCP has been monitoring it for years and he had already told her that her platelet count is a little low. Pt advised to call us if she has any questions or concerns. She agreed.

## 2020-10-22 NOTE — Progress Notes (Signed)
Paper results reviewed. Please tell the patient her blood cell count shows no sign of infection or significant blood loss.  Her platelet count is a little low at 120, which is somewhat expected with liver disease.  Her kidney function is essentially normal.  Her liver enzymes are normal.  Her clotting function is normal.  Her tumor marker is also normal.  Overall compared to last year her labs are perfectly stable.   Call us if she has any questions or concerns!  MELD: 8 Child-Pugh: A  Labs marked for scanning.

## 2021-04-02 ENCOUNTER — Encounter: Payer: Self-pay | Admitting: Gastroenterology

## 2021-04-02 ENCOUNTER — Ambulatory Visit: Payer: BC Managed Care – PPO | Admitting: Gastroenterology

## 2021-04-02 ENCOUNTER — Encounter: Payer: Self-pay | Admitting: *Deleted

## 2021-04-02 ENCOUNTER — Ambulatory Visit: Payer: BC Managed Care – PPO | Admitting: Nurse Practitioner

## 2021-04-02 ENCOUNTER — Other Ambulatory Visit: Payer: Self-pay

## 2021-04-02 VITALS — BP 124/72 | HR 83 | Temp 97.7°F | Ht 63.0 in | Wt 208.4 lb

## 2021-04-02 DIAGNOSIS — K7460 Unspecified cirrhosis of liver: Secondary | ICD-10-CM | POA: Diagnosis not present

## 2021-04-02 DIAGNOSIS — R197 Diarrhea, unspecified: Secondary | ICD-10-CM

## 2021-04-02 MED ORDER — ZENPEP 40000-126000 UNITS PO CPEP: ORAL_CAPSULE | ORAL | 5 refills | Status: DC

## 2021-04-02 MED ORDER — CLENPIQ 10-3.5-12 MG-GM -GM/160ML PO SOLN: 1.0000 | Freq: Once | ORAL | 0 refills | Status: AC

## 2021-04-02 NOTE — Patient Instructions (Addendum)
Please take labs to have done with Dr. [place] Crane when you see him and have faxed to [place].   We will do an ultrasound in Jan 2023.   I have arranged a colonoscopy and endoscopy with Dr. Abbey Chatters in the near future. Do not take metformin the day of the procedure.  I would like for you to try Zenpep: take 2 capsules with food and 1 with snacks. No more than 8 per day. Let us know how this works! I sent in a prescription.  Please complete the hepatitis vaccinations through your primary care.    It was a pleasure to see you today. I want to create trusting relationships with patients to provide genuine, compassionate, and quality care. I value your feedback. If you receive a survey regarding your visit,  I greatly appreciate you taking time to fill this out.   Annitta Needs, PhD, ANP-BC Physician [facility]

## 2021-04-02 NOTE — Progress Notes (Signed)
Referring Provider: Celene Squibb, MD Primary Care Physician:  Celene Squibb, MD Primary GI: Dr. Abbey Chatters  Chief Complaint  Patient presents with   Diarrhea   Cirrhosis    HPI:   Chelsea Estrada is a 55 y.o. female presenting today with a history of cirrhosis likely due to fatty liver, diarrhea felt to be stress-induced historically, last EGD in 2019 without varices but 2016 noted small Grade 1 varices. Colonoscopy on file from 2019. Due for RUQ [place] now. Has remained well-compensated. Desires only yearly ultrasounds.    +smoking. Gallbladder absent. Weight loss noted steadily over past 3-5 years, but she states this is intentional. Notes chronic diarrhea without improvement on dicyclomine. She has brought a calendar with her, noting a minimum of 6 stools per day and up to 20 looser stools daily. Chronically on metformin since her 40s, early 18s. Food goes "straight through" her. Creon didn't help in the past. No significant abdominal pain. She is frustrated with diarrhea symptoms. Affecting her quality of life.     Past Medical History:  Diagnosis Date   Family history of adverse reaction to anesthesia    mother-- PONV   Frequency of urination    History of kidney stones    Hypertension    Hypothyroidism    Lupus ([place])    effects joints--  rheumologist-  dr Amil Amen w/ [facility]   Right ureteral stone    Type 2 diabetes mellitus ([place])    Urgency of urination    Wears glasses     Past Surgical History:  Procedure Laterality Date   BIOPSY  02/17/2018   Procedure: BIOPSY;  Surgeon: Danie Binder, MD;  [facility];  Service: Endoscopy;;  duodenum;   CHOLECYSTECTOMY OPEN  age 41's   COLONOSCOPY N/A 04/10/2018   recto-sigmoid diverticulosis, torturous left colon, external/internal hemorrhoids   CYSTOSCOPY WITH RETROGRADE PYELOGRAM, URETEROSCOPY AND STENT PLACEMENT Right 04/04/2015   Procedure: [place], URETEROSCOPY AND STENT  PLACEMENT;  Surgeon: Alexis Frock, MD;  Location: [facility];  Service: Urology;  Laterality: Right;   ESOPHAGOGASTRODUODENOSCOPY N/A 08/25/2015   Grade 1 varices, patent Schatzki's ring, moderate erosive gastritis.   ESOPHAGOGASTRODUODENOSCOPY N/A 02/17/2018   non-obstructing Schatzki's ring, non-bleeding diverticulum in second portion of duodenum, erosive gastritis   HOLMIUM LASER APPLICATION Right [id number]   Procedure: HOLMIUM LASER APPLICATION;  Surgeon: Alexis Frock, MD;  Location: [facility];  Service: Urology;  Laterality: Right;    Current Outpatient Medications  Medication Sig Dispense Refill   acetaminophen (TYLENOL) 325 MG tablet Take 2 tablets (650 mg total) by mouth every 8 (eight) hours as needed for mild pain or moderate pain. NO MORE THAN 6 TABLETS A DAY 500 tablet 0   atorvastatin (LIPITOR) 10 MG tablet Take 10 mg by mouth daily.      Cholecalciferol (VITAMIN D) 50 MCG (2000 UT) tablet Take 2,000 Units by mouth daily.     dicyclomine (BENTYL) 10 MG capsule Take 1 capsule (10 mg total) by mouth 4 (four) times daily as needed (for abdominal pain or diarrhea). 30 capsule 3   EPINEPHrine 0.3 mg/0.3 mL IJ SOAJ injection as needed.     fenofibrate 160 MG tablet Take 145 mg by mouth at bedtime.      HYDROcodone-acetaminophen (NORCO/VICODIN) 5-325 MG tablet Take 1 tablet by mouth every 6 (six) hours as needed for moderate pain. For lupus     hydroxychloroquine (PLAQUENIL) 200 MG tablet Take  200 mg by mouth 2 (two) times daily.      Krill Oil 500 MG CAPS Take 500 mg by mouth in the morning and at bedtime.     lactobacillus acidophilus (BACID) TABS tablet Take 1 tablet by mouth daily.     levothyroxine (SYNTHROID, LEVOTHROID) 25 MCG tablet Take 25 mcg by mouth daily before breakfast.     lisinopril (PRINIVIL,ZESTRIL) 5 MG tablet Take 5 mg by mouth every morning.     magnesium oxide (MAG-OX) 400 MG tablet Take 400 mg by mouth daily.     metFORMIN  (GLUCOPHAGE) 500 MG tablet Take 1,000 mg by mouth 2 (two) times daily with a meal.     methocarbamol (ROBAXIN) 500 MG tablet Take 500 mg by mouth as needed.     metoprolol succinate (TOPROL-XL) 50 MG 24 hr tablet Take 50 mg by mouth every morning. Take with or immediately following a meal.     Milk Thistle 1000 MG CAPS Take 1,000 mg by mouth daily.      multivitamin (VIT W/EXTRA C) CHEW chewable tablet Chew 1 tablet by mouth daily.     omeprazole (PRILOSEC) 20 MG capsule TAKE 1 CAPSULE 30 MINUTES  PRIOR TO BREAKFAST AND     SUPPER (Patient taking differently: Take by mouth daily.) 180 capsule 3   OVER THE COUNTER MEDICATION Calcium Zinc Vitamin D once daily     Pancrelipase, Lip-Prot-Amyl, (ZENPEP) 40000-126000 units CPEP Take two capsules (80,000 units) with each meal and 1 capsule (40,000 units) with snacks, no more than 8 per day 120 capsule 5   tiZANidine (ZANAFLEX) 4 MG tablet Take 4 mg by mouth at bedtime.     TREMFYA 100 MG/ML SOPN Inject into the skin.     No current facility-administered medications for this visit.    Allergies as of 04/02/2021 - Review Complete 04/02/2021  Allergen Reaction Noted   Penicillins Hives 10/26/2010   Doxycycline Rash 07/18/2017   Sulfamethoxazole-trimethoprim Other (See Comments) and Rash 09/18/2019    Family History  Problem Relation Age of Onset   Liver disease Mother    Diabetes type II Mother    Hypertension Mother    Diabetes Mellitus II Father    Hypertension Father    Heart disease Father    Colon cancer Neg Hx     Social History   Socioeconomic History   Marital status: Married    Spouse name: Not on file   Number of children: Not on file   Years of education: Not on file   Highest education level: Not on file  Occupational History   Not on file  Tobacco Use   Smoking status: Every Day    Packs/day: 1.00    Years: 0.00    Pack years: 0.00    Types: Cigarettes   Smokeless tobacco: Never   Tobacco comments:    one pack  daily  Substance and Sexual Activity   Alcohol use: Yes    Alcohol/week: 0.0 standard drinks    Comment: Very rarely: typically once every 6+ months; typically 1 drink per occurrence.   Drug use: No   Sexual activity: Not on file  Other Topics Concern   Not on file  Social History Narrative   Not on file   Social Determinants of Health   Financial Resource Strain: Not on file  Food Insecurity: Not on file  Transportation Needs: Not on file  Physical Activity: Not on file  Stress: Not on file  Social Connections: Not  on file    Review of Systems: Gen: Denies fever, chills, anorexia. Denies fatigue, weakness, weight loss.  CV: Denies chest pain, palpitations, syncope, peripheral edema, and claudication. Resp: Denies dyspnea at rest, cough, wheezing, coughing up blood, and pleurisy. GI: see HPI Derm: Denies rash, itching, dry skin Psych: Denies depression, anxiety, memory loss, confusion. No homicidal or suicidal ideation.  Heme: Denies bruising, bleeding, and enlarged lymph nodes.  Physical Exam: BP 124/72   Pulse 83   Temp 97.7 F (36.5 C) (Temporal)   Ht '5\' 3"'$  (1.6 m)   Wt 208 lb 6.4 oz (94.5 kg)   BMI 36.92 kg/m  General:   Alert and oriented. No distress noted. Pleasant and cooperative.  Head:  Normocephalic and atraumatic. Eyes:  Conjuctiva clear without scleral icterus. Mouth:  mask in place Abdomen:  +BS, soft, non-tender and non-distended. No rebound or guarding. No HSM or masses noted. Msk:  Symmetrical without gross deformities. Normal posture. Extremities:  Without edema. Neurologic:  Alert and  oriented x4 Psych:  Alert and cooperative. Normal mood and affect.  ASSESSMENT: Chelsea Estrada is a 55 y.o. female presenting today with a history of cirrhosis likely due to fatty liver (prior evaluation 2016 including Hep C, B, ASMA, iron studies, need additional serologies) chronic diarrhea, last EGD in 2019 without varices but 2016 noted small Grade 1 varices.  Colonoscopy on file from 2019. Has remained well-compensated. Desires only yearly ultrasounds, with next due in Feb 2023 if following this schedule.  Main complaint is of severe diarrhea that has been present many years. Affecting quality of life. No random colonic biopsies at time of colonoscopy in 2019. Differentials broad including severe IBS, bile salt diarrhea, pancreatic insufficiency, microscopic colitis, etc. Will start with colonoscopy. No improvement with Creon in the past. I have provided Zenpep samples instead. Dicyclomine without improvement. Checking celiac serologies. Although on metformin, she has been taking this for 20 years and did not have diarrhea for many of those while on it. Still, may need to consider a different agent.   Cirrhosis: well-compensated. EGD due now. Varices in 2016 but not in 2019. Update labs and will add additional labs that have not been completed (AMA, ANA, immunoglobulins).    PLAN:  Proceed with colonoscopy with random colonic biopsies and EGD by Dr. Abbey Chatters  in near future: the risks, benefits, and alternatives have been discussed with the patient in detail. The patient states understanding and desires to proceed.   Stop dicyclomine. Trial Zenpep 40,000 units, take 2 with meals and 1 with snacks. Samples and prescription provided.   [place] in Feb 2023  CBC, CMP, INR, AMA, ANA, immunoglobulins, ceruloplasmin ordered.   Prescription for Hep A/B vaccination provided as she does not recall ever receiving these   Annitta Needs, PhD, ANP-BC [facility]

## 2021-04-02 NOTE — H&P (View-Only) (Signed)
Referring Provider: Celene Squibb, MD Primary Care Physician:  Celene Squibb, MD Primary GI: Dr. Abbey Chatters  Chief Complaint  Patient presents with   Diarrhea   Cirrhosis    HPI:   Chelsea Estrada is a 55 y.o. female presenting today with a history of cirrhosis likely due to fatty liver, diarrhea felt to be stress-induced historically, last EGD in 2019 without varices but 2016 noted small Grade 1 varices. Colonoscopy on file from 2019. Due for RUQ [place] now. Has remained well-compensated. Desires only yearly ultrasounds.    +smoking. Gallbladder absent. Weight loss noted steadily over past 3-5 years, but she states this is intentional. Notes chronic diarrhea without improvement on dicyclomine. She has brought a calendar with her, noting a minimum of 6 stools per day and up to 20 looser stools daily. Chronically on metformin since her 72s, early 51s. Food goes "straight through" her. Creon didn't help in the past. No significant abdominal pain. She is frustrated with diarrhea symptoms. Affecting her quality of life.     Past Medical History:  Diagnosis Date   Family history of adverse reaction to anesthesia    mother-- PONV   Frequency of urination    History of kidney stones    Hypertension    Hypothyroidism    Lupus ([place])    effects joints--  rheumologist-  dr Amil Amen w/ [facility]   Right ureteral stone    Type 2 diabetes mellitus ([place])    Urgency of urination    Wears glasses     Past Surgical History:  Procedure Laterality Date   BIOPSY  02/17/2018   Procedure: BIOPSY;  Surgeon: Danie Binder, MD;  [facility];  Service: Endoscopy;;  duodenum;   CHOLECYSTECTOMY OPEN  age 47's   COLONOSCOPY N/A 04/10/2018   recto-sigmoid diverticulosis, torturous left colon, external/internal hemorrhoids   CYSTOSCOPY WITH RETROGRADE PYELOGRAM, URETEROSCOPY AND STENT PLACEMENT Right 04/04/2015   Procedure: [employer], URETEROSCOPY AND STENT  PLACEMENT;  Surgeon: Alexis Frock, MD;  Location: [facility];  Service: Urology;  Laterality: Right;   ESOPHAGOGASTRODUODENOSCOPY N/A 08/25/2015   Grade 1 varices, patent Schatzki's ring, moderate erosive gastritis.   ESOPHAGOGASTRODUODENOSCOPY N/A 02/17/2018   non-obstructing Schatzki's ring, non-bleeding diverticulum in second portion of duodenum, erosive gastritis   HOLMIUM LASER APPLICATION Right [id number]   Procedure: HOLMIUM LASER APPLICATION;  Surgeon: Alexis Frock, MD;  Location: [facility];  Service: Urology;  Laterality: Right;    Current Outpatient Medications  Medication Sig Dispense Refill   acetaminophen (TYLENOL) 325 MG tablet Take 2 tablets (650 mg total) by mouth every 8 (eight) hours as needed for mild pain or moderate pain. NO MORE THAN 6 TABLETS A DAY 500 tablet 0   atorvastatin (LIPITOR) 10 MG tablet Take 10 mg by mouth daily.      Cholecalciferol (VITAMIN D) 50 MCG (2000 UT) tablet Take 2,000 Units by mouth daily.     dicyclomine (BENTYL) 10 MG capsule Take 1 capsule (10 mg total) by mouth 4 (four) times daily as needed (for abdominal pain or diarrhea). 30 capsule 3   EPINEPHrine 0.3 mg/0.3 mL IJ SOAJ injection as needed.     fenofibrate 160 MG tablet Take 145 mg by mouth at bedtime.      HYDROcodone-acetaminophen (NORCO/VICODIN) 5-325 MG tablet Take 1 tablet by mouth every 6 (six) hours as needed for moderate pain. For lupus     hydroxychloroquine (PLAQUENIL) 200 MG tablet Take  200 mg by mouth 2 (two) times daily.      Krill Oil 500 MG CAPS Take 500 mg by mouth in the morning and at bedtime.     lactobacillus acidophilus (BACID) TABS tablet Take 1 tablet by mouth daily.     levothyroxine (SYNTHROID, LEVOTHROID) 25 MCG tablet Take 25 mcg by mouth daily before breakfast.     lisinopril (PRINIVIL,ZESTRIL) 5 MG tablet Take 5 mg by mouth every morning.     magnesium oxide (MAG-OX) 400 MG tablet Take 400 mg by mouth daily.     metFORMIN  (GLUCOPHAGE) 500 MG tablet Take 1,000 mg by mouth 2 (two) times daily with a meal.     methocarbamol (ROBAXIN) 500 MG tablet Take 500 mg by mouth as needed.     metoprolol succinate (TOPROL-XL) 50 MG 24 hr tablet Take 50 mg by mouth every morning. Take with or immediately following a meal.     Milk Thistle 1000 MG CAPS Take 1,000 mg by mouth daily.      multivitamin (VIT W/EXTRA C) CHEW chewable tablet Chew 1 tablet by mouth daily.     omeprazole (PRILOSEC) 20 MG capsule TAKE 1 CAPSULE 30 MINUTES  PRIOR TO BREAKFAST AND     SUPPER (Patient taking differently: Take by mouth daily.) 180 capsule 3   OVER THE COUNTER MEDICATION Calcium Zinc Vitamin D once daily     Pancrelipase, Lip-Prot-Amyl, (ZENPEP) 40000-126000 units CPEP Take two capsules (80,000 units) with each meal and 1 capsule (40,000 units) with snacks, no more than 8 per day 120 capsule 5   tiZANidine (ZANAFLEX) 4 MG tablet Take 4 mg by mouth at bedtime.     TREMFYA 100 MG/ML SOPN Inject into the skin.     No current facility-administered medications for this visit.    Allergies as of 04/02/2021 - Review Complete 04/02/2021  Allergen Reaction Noted   Penicillins Hives 10/26/2010   Doxycycline Rash 07/18/2017   Sulfamethoxazole-trimethoprim Other (See Comments) and Rash 09/18/2019    Family History  Problem Relation Age of Onset   Liver disease Mother    Diabetes type II Mother    Hypertension Mother    Diabetes Mellitus II Father    Hypertension Father    Heart disease Father    Colon cancer Neg Hx     Social History   Socioeconomic History   Marital status: Married    Spouse name: Not on file   Number of children: Not on file   Years of education: Not on file   Highest education level: Not on file  Occupational History   Not on file  Tobacco Use   Smoking status: Every Day    Packs/day: 1.00    Years: 0.00    Pack years: 0.00    Types: Cigarettes   Smokeless tobacco: Never   Tobacco comments:    one pack  daily  Substance and Sexual Activity   Alcohol use: Yes    Alcohol/week: 0.0 standard drinks    Comment: Very rarely: typically once every 6+ months; typically 1 drink per occurrence.   Drug use: No   Sexual activity: Not on file  Other Topics Concern   Not on file  Social History Narrative   Not on file   Social Determinants of Health   Financial Resource Strain: Not on file  Food Insecurity: Not on file  Transportation Needs: Not on file  Physical Activity: Not on file  Stress: Not on file  Social Connections: Not  on file    Review of Systems: Gen: Denies fever, chills, anorexia. Denies fatigue, weakness, weight loss.  CV: Denies chest pain, palpitations, syncope, peripheral edema, and claudication. Resp: Denies dyspnea at rest, cough, wheezing, coughing up blood, and pleurisy. GI: see HPI Derm: Denies rash, itching, dry skin Psych: Denies depression, anxiety, memory loss, confusion. No homicidal or suicidal ideation.  Heme: Denies bruising, bleeding, and enlarged lymph nodes.  Physical Exam: BP 124/72   Pulse 83   Temp 97.7 F (36.5 C) (Temporal)   Ht '5\' 3"'$  (1.6 m)   Wt 208 lb 6.4 oz (94.5 kg)   BMI 36.92 kg/m  General:   Alert and oriented. No distress noted. Pleasant and cooperative.  Head:  Normocephalic and atraumatic. Eyes:  Conjuctiva clear without scleral icterus. Mouth:  mask in place Abdomen:  +BS, soft, non-tender and non-distended. No rebound or guarding. No HSM or masses noted. Msk:  Symmetrical without gross deformities. Normal posture. Extremities:  Without edema. Neurologic:  Alert and  oriented x4 Psych:  Alert and cooperative. Normal mood and affect.  ASSESSMENT: DEIJAH BLONDER is a 55 y.o. female presenting today with a history of cirrhosis likely due to fatty liver (prior evaluation 2016 including Hep C, B, ASMA, iron studies, need additional serologies) chronic diarrhea, last EGD in 2019 without varices but 2016 noted small Grade 1 varices.  Colonoscopy on file from 2019. Has remained well-compensated. Desires only yearly ultrasounds, with next due in Feb 2023 if following this schedule.  Main complaint is of severe diarrhea that has been present many years. Affecting quality of life. No random colonic biopsies at time of colonoscopy in 2019. Differentials broad including severe IBS, bile salt diarrhea, pancreatic insufficiency, microscopic colitis, etc. Will start with colonoscopy. No improvement with Creon in the past. I have provided Zenpep samples instead. Dicyclomine without improvement. Checking celiac serologies. Although on metformin, she has been taking this for 20 years and did not have diarrhea for many of those while on it. Still, may need to consider a different agent.   Cirrhosis: well-compensated. EGD due now. Varices in 2016 but not in 2019. Update labs and will add additional labs that have not been completed (AMA, ANA, immunoglobulins).    PLAN:  Proceed with colonoscopy with random colonic biopsies and EGD by Dr. Abbey Chatters  in near future: the risks, benefits, and alternatives have been discussed with the patient in detail. The patient states understanding and desires to proceed.   Stop dicyclomine. Trial Zenpep 40,000 units, take 2 with meals and 1 with snacks. Samples and prescription provided.   [place] in Feb 2023  CBC, CMP, INR, AMA, ANA, immunoglobulins, ceruloplasmin ordered.   Prescription for Hep A/B vaccination provided as she does not recall ever receiving these   Annitta Needs, PhD, ANP-BC [facility]

## 2021-04-03 ENCOUNTER — Telehealth: Payer: Self-pay | Admitting: Gastroenterology

## 2021-04-03 NOTE — Telephone Encounter (Signed)
Chelsea Estrada, I printed out an extra lab (ceruloplasmin) for patient to have done at Dr. Juel Burrow office when she goes. Can we find out when she is going and either mail to her if time or fax to Dr Juel Burrow office? Thanks!

## 2021-04-06 NOTE — Telephone Encounter (Signed)
Spoke to pt. She informed me that she has an appt at Dr. Nevada Crane office on 04/14/2021, but is having blood work drawn on 04/09/2021. I have faxed lab requisition to office.

## 2021-04-21 ENCOUNTER — Encounter (HOSPITAL_COMMUNITY)
Admission: RE | Disposition: A | Discharge status: Home / Self Care | Payer: Self-pay | Source: Home / Self Care | Attending: Internal Medicine

## 2021-04-21 ENCOUNTER — Ambulatory Visit (HOSPITAL_COMMUNITY): Payer: BC Managed Care – PPO | Admitting: Anesthesiology

## 2021-04-21 ENCOUNTER — Encounter (HOSPITAL_COMMUNITY): Payer: Self-pay

## 2021-04-21 ENCOUNTER — Ambulatory Visit (HOSPITAL_COMMUNITY)
Admission: RE | Admit: 2021-04-21 | Discharge: 2021-04-21 | Disposition: A | Discharge status: Home / Self Care | Payer: BC Managed Care – PPO | Attending: Internal Medicine | Admitting: Internal Medicine

## 2021-04-21 ENCOUNTER — Other Ambulatory Visit: Payer: Self-pay

## 2021-04-21 DIAGNOSIS — K5730 Diverticulosis of large intestine without perforation or abscess without bleeding: Secondary | ICD-10-CM

## 2021-04-21 DIAGNOSIS — K648 Other hemorrhoids: Secondary | ICD-10-CM | POA: Diagnosis not present

## 2021-04-21 DIAGNOSIS — K7460 Unspecified cirrhosis of liver: Secondary | ICD-10-CM | POA: Diagnosis present

## 2021-04-21 DIAGNOSIS — Z7984 Long term (current) use of oral hypoglycemic drugs: Secondary | ICD-10-CM | POA: Diagnosis not present

## 2021-04-21 DIAGNOSIS — K2970 Gastritis, unspecified, without bleeding: Secondary | ICD-10-CM

## 2021-04-21 DIAGNOSIS — K52832 Lymphocytic colitis: Secondary | ICD-10-CM | POA: Insufficient documentation

## 2021-04-21 DIAGNOSIS — K529 Noninfective gastroenteritis and colitis, unspecified: Secondary | ICD-10-CM | POA: Diagnosis present

## 2021-04-21 DIAGNOSIS — Z8719 Personal history of other diseases of the digestive system: Secondary | ICD-10-CM | POA: Insufficient documentation

## 2021-04-21 DIAGNOSIS — Z79899 Other long term (current) drug therapy: Secondary | ICD-10-CM | POA: Insufficient documentation

## 2021-04-21 DIAGNOSIS — F17210 Nicotine dependence, cigarettes, uncomplicated: Secondary | ICD-10-CM | POA: Diagnosis not present

## 2021-04-21 DIAGNOSIS — K3189 Other diseases of stomach and duodenum: Secondary | ICD-10-CM | POA: Diagnosis not present

## 2021-04-21 DIAGNOSIS — I8510 Secondary esophageal varices without bleeding: Secondary | ICD-10-CM | POA: Diagnosis not present

## 2021-04-21 DIAGNOSIS — K766 Portal hypertension: Secondary | ICD-10-CM | POA: Diagnosis not present

## 2021-04-21 DIAGNOSIS — Z79890 Hormone replacement therapy (postmenopausal): Secondary | ICD-10-CM | POA: Diagnosis not present

## 2021-04-21 HISTORY — PX: ESOPHAGOGASTRODUODENOSCOPY (EGD) WITH PROPOFOL: SHX5813

## 2021-04-21 HISTORY — PX: COLONOSCOPY WITH PROPOFOL: SHX5780

## 2021-04-21 HISTORY — PX: BIOPSY: SHX5522

## 2021-04-21 LAB — GLUCOSE, CAPILLARY: Glucose-Capillary: 108 mg/dL — ABNORMAL HIGH (ref 70–99)

## 2021-04-21 SURGERY — COLONOSCOPY WITH PROPOFOL
Anesthesia: General

## 2021-04-21 MED ORDER — PHENYLEPHRINE 40 MCG/ML (10ML) SYRINGE FOR IV PUSH (FOR BLOOD PRESSURE SUPPORT)
PREFILLED_SYRINGE | INTRAVENOUS | Status: DC | PRN
  Administered 2021-04-21 (×3): 80 ug via INTRAVENOUS

## 2021-04-21 MED ORDER — PHENYLEPHRINE 40 MCG/ML (10ML) SYRINGE FOR IV PUSH (FOR BLOOD PRESSURE SUPPORT)
PREFILLED_SYRINGE | INTRAVENOUS | Status: AC
  Filled 2021-04-21: qty 10

## 2021-04-21 MED ORDER — LACTATED RINGERS IV SOLN: INTRAVENOUS | Status: DC

## 2021-04-21 MED ORDER — PROPOFOL 10 MG/ML IV BOLUS
INTRAVENOUS | Status: DC | PRN
Start: 2021-04-21 — End: 2021-04-21
  Administered 2021-04-21: 30 mg via INTRAVENOUS
  Administered 2021-04-21: 50 mg via INTRAVENOUS
  Administered 2021-04-21: 100 mg via INTRAVENOUS

## 2021-04-21 MED ORDER — LIDOCAINE HCL (CARDIAC) PF 100 MG/5ML IV SOSY
PREFILLED_SYRINGE | INTRAVENOUS | Status: DC | PRN
  Administered 2021-04-21: 50 mg via INTRAVENOUS

## 2021-04-21 MED ORDER — PROPOFOL 500 MG/50ML IV EMUL
INTRAVENOUS | Status: DC | PRN
  Administered 2021-04-21: 150 ug/kg/min via INTRAVENOUS

## 2021-04-21 NOTE — Anesthesia Procedure Notes (Signed)
Date/Time: 04/21/2021 9:10 AM Performed by: Orlie Dakin, CRNA Pre-anesthesia Checklist: Patient identified, Emergency Drugs available, Suction available and Patient being monitored Patient Re-evaluated:Patient Re-evaluated prior to induction Oxygen Delivery Method: Nasal cannula Induction Type: IV induction Placement Confirmation: positive ETCO2

## 2021-04-21 NOTE — Op Note (Signed)
[facility] Patient Name: Chelsea Estrada Procedure Date: 04/21/2021 9:14 AM MRN: [id number] Date of Birth: 1966/02/19 Attending MD: Elon Alas. Abbey Chatters DO CSN: [id number] Age: 55 Admit Type: Outpatient Procedure:                Colonoscopy Indications:              Chronic diarrhea Providers:                Elon Alas. Abbey Chatters, DO, Gwenlyn Fudge, RN, Aram Candela Referring MD:              Medicines:                See the Anesthesia note for documentation of the                            administered medications Complications:            No immediate complications. Estimated Blood Loss:     Estimated blood loss was minimal. Procedure:                Pre-Anesthesia Assessment:                           - The anesthesia plan was to use monitored                            anesthesia care (MAC).                           After obtaining informed consent, the colonoscope                            was passed under direct vision. Throughout the                            procedure, the patient's blood pressure, pulse, and                            oxygen saturations were monitored continuously. The                            [id number]-HQ190L ([id number]) scope was introduced through                            the anus and advanced to the the terminal ileum,                            with identification of the appendiceal orifice and                            IC valve. The colonoscopy was performed without                            difficulty. The patient tolerated the procedure  well. The quality of the bowel preparation was                            evaluated using the BBPS [facility] Bowel Preparation                            Scale) with scores of: Right Colon = 3, Transverse                            Colon = 3 and Left Colon = 3 (entire mucosa seen                            well with no residual staining, small fragments of                             stool or opaque liquid). The total BBPS score                            equals 9. Scope In: 9:16:49 AM Scope Out: 9:27:13 AM Scope Withdrawal Time: 0 hours 6 minutes 18 seconds  Total Procedure Duration: 0 hours 10 minutes 24 seconds  Findings:      The perianal and digital rectal examinations were normal.      Non-bleeding internal hemorrhoids were found during endoscopy.      Multiple small and large-mouthed diverticula were found in the sigmoid       colon and descending colon.      The terminal ileum appeared normal.      Biopsies for histology were taken with a cold forceps from the ascending       colon, transverse colon and descending colon for evaluation of       microscopic colitis. Impression:               - Non-bleeding internal hemorrhoids.                           - Diverticulosis in the sigmoid colon and in the                            descending colon.                           - The examined portion of the ileum was normal.                           - Biopsies were taken with a cold forceps from the                            ascending colon, transverse colon and descending                            colon for evaluation of microscopic colitis. Moderate Sedation:      Per Anesthesia Care Recommendation:           - Patient has a contact number available for  emergencies. The signs and symptoms of potential                            delayed complications were discussed with the                            patient. Return to normal activities tomorrow.                            Written discharge instructions were provided to the                            patient.                           - Resume previous diet.                           - Continue present medications.                           - Await pathology results.                           - Repeat colonoscopy in 10 years for screening                             purposes.                           - Return to GI clinic in 3 months. Procedure Code(s):        --- Professional ---                           [id number], Colonoscopy, flexible; with biopsy, single                            or multiple Diagnosis Code(s):        --- Professional ---                           K64.8, Other hemorrhoids                           K52.9, Noninfective gastroenteritis and colitis,                            unspecified                           K57.30, Diverticulosis of large intestine without                            perforation or abscess without bleeding CPT copyright 2019 American Medical Association. All rights reserved. The codes documented in this report are preliminary and upon coder review may  be revised to meet current compliance requirements. Elon Alas. Abbey Chatters, DO Murphy Chardon, DO 04/21/2021 9:31:05 AM  This report has been signed electronically. Number of Addenda: 0

## 2021-04-21 NOTE — Interval H&P Note (Signed)
History and Physical Interval Note:  04/21/2021 8:37 AM  Chelsea Estrada  has presented today for surgery, with the diagnosis of crohns, diarrhea.  The various methods of treatment have been discussed with the patient and family. After consideration of risks, benefits and other options for treatment, the patient has consented to  Procedure(s) with comments: COLONOSCOPY WITH PROPOFOL (N/A) - 8:45am ESOPHAGOGASTRODUODENOSCOPY (EGD) WITH PROPOFOL (N/A) as a surgical intervention.  The patient's history has been reviewed, patient examined, no change in status, stable for surgery.  I have reviewed the patient's chart and labs.  Questions were answered to the patient's satisfaction.     [name]

## 2021-04-21 NOTE — Op Note (Signed)
[facility] Patient Name: Chelsea Estrada Procedure Date: 04/21/2021 8:54 AM MRN: [id number] Date of Birth: 12-Mar-1966 Attending MD: Elon Alas. Abbey Chatters DO CSN: [id number] Age: 55 Admit Type: Outpatient Procedure:                Upper GI endoscopy Indications:              Cirrhosis rule out esophageal varices Providers:                Elon Alas. Abbey Chatters, DO, Gwenlyn Fudge, RN, Aram Candela Referring MD:              Medicines:                See the Anesthesia note for documentation of the                            administered medications Complications:            No immediate complications. Estimated Blood Loss:     Estimated blood loss was minimal. Procedure:                Pre-Anesthesia Assessment:                           - The anesthesia plan was to use monitored                            anesthesia care (MAC).                           After obtaining informed consent, the endoscope was                            passed under direct vision. Throughout the                            procedure, the patient's blood pressure, pulse, and                            oxygen saturations were monitored continuously. The                            [id number]-H190 ([id number]) scope was introduced through the                            mouth, and advanced to the second part of duodenum.                            The upper GI endoscopy was accomplished without                            difficulty. The patient tolerated the procedure                            well. Scope In: 9:09:07  AM Scope Out: 9:11:56 AM Total Procedure Duration: 0 hours 2 minutes 49 seconds  Findings:      Two columns of grade I varices with no bleeding and no stigmata of       recent bleeding were found in the distal esophagus,. These completely       flattened with insufflation. No red wale signs were present.      Mild portal hypertensive gastropathy was found in the entire examined        stomach.      Localized mild inflammation characterized by erythema was found in the       gastric antrum. Biopsies were taken with a cold forceps for Helicobacter       pylori testing.      The duodenal bulb, first portion of the duodenum and second portion of       the duodenum were normal. Impression:               - Grade I esophageal varices with no bleeding and                            no stigmata of recent bleeding.                           - Portal hypertensive gastropathy.                           - Gastritis. Biopsied.                           - Normal duodenal bulb, first portion of the                            duodenum and second portion of the duodenum. Moderate Sedation:      Per Anesthesia Care Recommendation:           - Patient has a contact number available for                            emergencies. The signs and symptoms of potential                            delayed complications were discussed with the                            patient. Return to normal activities tomorrow.                            Written discharge instructions were provided to the                            patient.                           - Resume previous diet.                           - Continue present medications.                           -  Await pathology results.                           - Repeat upper endoscopy in 3 years for                            surveillance or sooner if decompensating event.                           - Return to GI clinic in 3 months.                           - Consider switching Metoprolol to Carvedilol or                            NSBB for primary ppx Procedure Code(s):        --- Professional ---                           [phone], Esophagogastroduodenoscopy, flexible,                            transoral; with biopsy, single or multiple Diagnosis Code(s):        --- Professional ---                           K74.60, Unspecified cirrhosis of  liver                           I85.10, Secondary esophageal varices without                            bleeding                           K76.6, Portal hypertension                           K31.89, Other diseases of stomach and duodenum                           K29.70, Gastritis, unspecified, without bleeding CPT copyright 2019 American Medical Association. All rights reserved. The codes documented in this report are preliminary and upon coder review may  be revised to meet current compliance requirements. Elon Alas. Abbey Chatters, DO [place] Abbey Chatters, DO 04/21/2021 9:15:08 AM This report has been signed electronically. Number of Addenda: 0

## 2021-04-21 NOTE — Transfer of Care (Signed)
Immediate Anesthesia Transfer of Care Note  Patient: Chelsea Estrada  Procedure(s) Performed: COLONOSCOPY WITH PROPOFOL ESOPHAGOGASTRODUODENOSCOPY (EGD) WITH PROPOFOL BIOPSY  Patient Location: Endoscopy Unit  Anesthesia Type:General  Level of Consciousness: awake, alert  and oriented  Airway & Oxygen Therapy: Patient Spontanous Breathing  Post-op Assessment: Report given to RN and Post -op Vital signs reviewed and stable  Post vital signs: Reviewed and stable  Last Vitals:  Vitals Value Taken Time  BP    Temp    Pulse    Resp    SpO2      Last Pain:  Vitals:   04/21/21 0905  TempSrc:   PainSc: 0-No pain      Patients Stated Pain Goal: 8 ([id number])  Complications: No notable events documented.

## 2021-04-21 NOTE — Anesthesia Preprocedure Evaluation (Addendum)
Anesthesia Evaluation  Patient identified by MRN, date of birth, ID band Patient awake    Reviewed: Allergy & Precautions, NPO status , Patient's Chart, lab work & pertinent test results, reviewed documented beta blocker date and time   History of Anesthesia Complications (+) Family history of anesthesia reaction  Airway Mallampati: II  TM Distance: >3 FB Neck ROM: Full    Dental  (+) Dental Advisory Given, Caps   Pulmonary Current Smoker and Patient abstained from smoking.,    Pulmonary exam normal breath sounds clear to auscultation       Cardiovascular Exercise Tolerance: Good hypertension, Pt. on medications and Pt. on home beta blockers Normal cardiovascular exam Rhythm:Regular Rate:Normal     Neuro/Psych negative neurological ROS     GI/Hepatic negative GI ROS, (+) Cirrhosis   Esophageal Varices    ,   Endo/Other  diabetes, Well Controlled, Type 2, Oral Hypoglycemic AgentsHypothyroidism   Renal/GU negative Renal ROS     Musculoskeletal negative musculoskeletal ROS (+)   Abdominal   Peds  Hematology negative hematology ROS (+)   Anesthesia Other Findings   Reproductive/Obstetrics negative OB ROS                            Anesthesia Physical Anesthesia Plan  ASA: 2  Anesthesia Plan: General   Post-op Pain Management:    Induction: Intravenous  PONV Risk Score and Plan: Propofol infusion  Airway Management Planned: Nasal Cannula and Natural Airway  Additional Equipment:   Intra-op Plan:   Post-operative Plan:   Informed Consent: I have reviewed the patients History and Physical, chart, labs and discussed the procedure including the risks, benefits and alternatives for the proposed anesthesia with the patient or authorized representative who has indicated his/her understanding and acceptance.     Dental advisory given  Plan Discussed with: CRNA and  Surgeon  Anesthesia Plan Comments:         Anesthesia Quick Evaluation

## 2021-04-21 NOTE — Anesthesia Postprocedure Evaluation (Signed)
Anesthesia Post Note  Patient: Chelsea Estrada  Procedure(s) Performed: COLONOSCOPY WITH PROPOFOL ESOPHAGOGASTRODUODENOSCOPY (EGD) WITH PROPOFOL BIOPSY  Patient location during evaluation: Endoscopy Anesthesia Type: General Level of consciousness: awake and alert and oriented Pain management: pain level controlled Vital Signs Assessment: post-procedure vital signs reviewed and stable Respiratory status: spontaneous breathing and respiratory function stable Cardiovascular status: blood pressure returned to baseline and stable Postop Assessment: no apparent nausea or vomiting Anesthetic complications: no   No notable events documented.   Last Vitals:  Vitals:   04/21/21 0833 04/21/21 0931  BP: 124/72 106/63  Pulse: 77   Resp: 17 19  Temp: 36.6 C 36.6 C  SpO2: 100% 98%    Last Pain:  Vitals:   04/21/21 0931  TempSrc: Oral  PainSc: 0-No pain                 Zigmund Linse C Zen Cedillos

## 2021-04-21 NOTE — Discharge Instructions (Addendum)
EGD Discharge instructions Please read the instructions outlined below and refer to this sheet in the next few weeks. These discharge instructions provide you with general information on caring for yourself after you leave the hospital. Your doctor may also give you specific instructions. While your treatment has been planned according to the most current medical practices available, unavoidable complications occasionally occur. If you have any problems or questions after discharge, please call your doctor. ACTIVITY You may resume your regular activity but move at a slower pace for the next 24 hours.  Take frequent rest periods for the next 24 hours.  Walking will help expel (get rid of) the air and reduce the bloated feeling in your abdomen.  No driving for 24 hours (because of the anesthesia (medicine) used during the test).  You may shower.  Do not sign any important legal documents or operate any machinery for 24 hours (because of the anesthesia used during the test).  NUTRITION Drink plenty of fluids.  You may resume your normal diet.  Begin with a light meal and progress to your normal diet.  Avoid alcoholic beverages for 24 hours or as instructed by your caregiver.  MEDICATIONS You may resume your normal medications unless your caregiver tells you otherwise.  WHAT YOU CAN EXPECT TODAY You may experience abdominal discomfort such as a feeling of fullness or "gas" pains.  FOLLOW-UP Your doctor will discuss the results of your test with you.  SEEK IMMEDIATE MEDICAL ATTENTION IF ANY OF THE FOLLOWING OCCUR: Excessive nausea (feeling sick to your stomach) and/or vomiting.  Severe abdominal pain and distention (swelling).  Trouble swallowing.  Temperature over 101 F (37.8 C).  Rectal bleeding or vomiting of blood.    Colonoscopy Discharge Instructions  Read the instructions outlined below and refer to this sheet in the next few weeks. These discharge instructions provide you with  general information on caring for yourself after you leave the hospital. Your doctor may also give you specific instructions. While your treatment has been planned according to the most current medical practices available, unavoidable complications occasionally occur.   ACTIVITY You may resume your regular activity, but move at a slower pace for the next 24 hours.  Take frequent rest periods for the next 24 hours.  Walking will help get rid of the air and reduce the bloated feeling in your belly (abdomen).  No driving for 24 hours (because of the medicine (anesthesia) used during the test).   Do not sign any important legal documents or operate any machinery for 24 hours (because of the anesthesia used during the test).  NUTRITION Drink plenty of fluids.  You may resume your normal diet as instructed by your doctor.  Begin with a light meal and progress to your normal diet. Heavy or fried foods are harder to digest and may make you feel sick to your stomach (nauseated).  Avoid alcoholic beverages for 24 hours or as instructed.  MEDICATIONS You may resume your normal medications unless your doctor tells you otherwise.  WHAT YOU CAN EXPECT TODAY Some feelings of bloating in the abdomen.  Passage of more gas than usual.  Spotting of blood in your stool or on the toilet paper.  IF YOU HAD POLYPS REMOVED DURING THE COLONOSCOPY: No aspirin products for 7 days or as instructed.  No alcohol for 7 days or as instructed.  Eat a soft diet for the next 24 hours.  FINDING OUT THE RESULTS OF YOUR TEST Not all test results are available  during your visit. If your test results are not back during the visit, make an appointment with your caregiver to find out the results. Do not assume everything is normal if you have not heard from your caregiver or the medical facility. It is important for you to follow up on all of your test results.  SEEK IMMEDIATE MEDICAL ATTENTION IF: You have more than a spotting of  blood in your stool.  Your belly is swollen (abdominal distention).  You are nauseated or vomiting.  You have a temperature over 101.  You have abdominal pain or discomfort that is severe or gets worse throughout the day.   Your EGD revealed a mild amount inflammation in your stomach.  I took biopsies of this to rule infection with bacteria called H. pylori.  You did have small esophageal varices.  We will keep an eye on these.  Recommend repeat EGD in 3 years.  We may make adjustments to your metoprolol to a different beta-blocker though we will discuss further on follow-up visit.  Your colonoscopy was unremarkable.  I did not find a polyps or evidence of colon cancer.  I did take colon biopsies to rule out microscopic colitis which can cause chronic diarrhea.  Await pathology results, my office will contact you.  Recommend repeat colonoscopy in 10 years for screening purposes.  Follow-up with GI in 3 months.  I hope you have a great rest of your week!  Elon Alas. Abbey Chatters, D.O. Gastroenterology and Hepatology [facility]

## 2021-04-22 LAB — SURGICAL PATHOLOGY

## 2021-04-28 ENCOUNTER — Encounter (HOSPITAL_COMMUNITY): Payer: Self-pay | Admitting: Internal Medicine

## 2021-04-29 ENCOUNTER — Telehealth: Payer: Self-pay | Admitting: Internal Medicine

## 2021-04-29 DIAGNOSIS — K52832 Lymphocytic colitis: Secondary | ICD-10-CM

## 2021-04-29 MED ORDER — BUDESONIDE 3 MG PO CPEP: ORAL_CAPSULE | ORAL | 0 refills | Status: DC

## 2021-04-29 NOTE — Telephone Encounter (Signed)
Patient's colon biopsies show lymphocytic colitis.  Called and discussed this in depth with patient today.  I will start her on budesonide 9 mg daily for 8 weeks then decrease to 6 mg daily for 2 weeks then 3 mg daily for 2 weeks.  Patient already has follow-up to see [place] in December.  Told her to keep this appointment.

## 2021-05-06 ENCOUNTER — Telehealth: Payer: Self-pay | Admitting: Gastroenterology

## 2021-05-06 NOTE — Telephone Encounter (Signed)
Received outside labs dated 04/15/21:  Hep B surface antibody negative.  Hep A antibody negative Hep B surface antigen negative Hep B core negative HCV antibody negative Negative celiac serologies Vit D 42 AMA negative TSH 4.4 ANA negative INR 1.1. IgA, IgM, IgG all normal Creatinine 0.94, BUN 12 Tbili 0.6, Alk Phos 60, AST 31, ALT 14 Hgb 12.6, Hct 37.6, platelets 94   She has already been given prescription for Hep A/B vaccination. Dealing with fatty liver.

## 2021-05-12 ENCOUNTER — Telehealth: Payer: Self-pay | Admitting: Internal Medicine

## 2021-05-12 NOTE — Telephone Encounter (Signed)
Error

## 2021-05-12 NOTE — Telephone Encounter (Signed)
[phone] please call patient , she has some questions

## 2021-05-12 NOTE — Telephone Encounter (Signed)
Phoned and LMOVM 

## 2021-05-13 ENCOUNTER — Telehealth: Payer: Self-pay

## 2021-05-13 MED ORDER — OMEPRAZOLE 20 MG PO CPDR: 20.0000 mg | DELAYED_RELEASE_CAPSULE | Freq: Every day | ORAL | 3 refills | Status: DC

## 2021-05-13 NOTE — Telephone Encounter (Signed)
Dr Abbey Chatters the pt called and advised that every since the colonoscopy when you put her on Budesonide the pt states she has gained 12 lbs of fluid. The fluid is mostly in her legs as before. She took Furosamide that came from another Dr to start pulling off the fluid. The pt states that either she will need something else to replace the Budesonide or she will stop it all together. Pt uses [facility] now. Please advise.

## 2021-05-13 NOTE — Telephone Encounter (Signed)
Completed.

## 2021-05-13 NOTE — Telephone Encounter (Signed)
Pt states she needs a refill on her Omeprazole sent to [facility].

## 2021-05-13 NOTE — Telephone Encounter (Signed)
Budesonide is the best medication for this condition.  If she absolutely cannot tolerate it due to fluid issues, she can try over-the-counter Pepto-Bismol three 262 mg chewable tablets three times daily.  Would also recommend she stop her omeprazole if able as this can cause microscopic colitis.  She can take over-the-counter Pepcid instead for heartburn/reflux.

## 2021-05-13 NOTE — Addendum Note (Signed)
Addended by: Annitta Needs on: 05/13/2021 11:46 AM   Modules accepted: Orders

## 2021-05-14 NOTE — Telephone Encounter (Signed)
Phoned and spoke with the pt . Gave her the instructions regarding the OTC pepto bismo; and pepcid. And recommendations of stopping her omeprazole. The pt expressed understanding of this.

## 2021-06-04 ENCOUNTER — Other Ambulatory Visit (HOSPITAL_COMMUNITY): Payer: Self-pay | Admitting: Internal Medicine

## 2021-06-04 DIAGNOSIS — Z1231 Encounter for screening mammogram for malignant neoplasm of breast: Secondary | ICD-10-CM

## 2021-06-10 ENCOUNTER — Ambulatory Visit (HOSPITAL_COMMUNITY)
Admission: RE | Admit: 2021-06-10 | Discharge: 2021-06-10 | Disposition: A | Discharge status: Home / Self Care | Payer: BC Managed Care – PPO | Source: Ambulatory Visit | Attending: Internal Medicine | Admitting: Internal Medicine

## 2021-06-10 ENCOUNTER — Other Ambulatory Visit: Payer: Self-pay

## 2021-06-10 DIAGNOSIS — Z1231 Encounter for screening mammogram for malignant neoplasm of breast: Secondary | ICD-10-CM | POA: Diagnosis present

## 2021-07-27 ENCOUNTER — Other Ambulatory Visit (HOSPITAL_COMMUNITY): Payer: Self-pay | Admitting: Family Medicine

## 2021-07-27 ENCOUNTER — Other Ambulatory Visit: Payer: Self-pay | Admitting: Family Medicine

## 2021-07-27 DIAGNOSIS — F17200 Nicotine dependence, unspecified, uncomplicated: Secondary | ICD-10-CM

## 2021-08-19 ENCOUNTER — Ambulatory Visit: Payer: BC Managed Care – PPO | Admitting: Gastroenterology

## 2021-08-19 ENCOUNTER — Encounter: Payer: Self-pay | Admitting: Gastroenterology

## 2021-08-19 ENCOUNTER — Other Ambulatory Visit: Payer: Self-pay

## 2021-08-19 ENCOUNTER — Encounter: Payer: Self-pay | Admitting: Internal Medicine

## 2021-08-19 ENCOUNTER — Encounter: Payer: Self-pay | Admitting: *Deleted

## 2021-08-19 VITALS — BP 124/60 | HR 78 | Temp 96.6°F | Ht 63.0 in | Wt 204.4 lb

## 2021-08-19 DIAGNOSIS — K52839 Microscopic colitis, unspecified: Secondary | ICD-10-CM | POA: Insufficient documentation

## 2021-08-19 DIAGNOSIS — K52832 Lymphocytic colitis: Secondary | ICD-10-CM | POA: Diagnosis not present

## 2021-08-19 DIAGNOSIS — K7460 Unspecified cirrhosis of liver: Secondary | ICD-10-CM

## 2021-08-19 NOTE — Patient Instructions (Signed)
I recommend stopping omeprazole. If you have any reflux, you can take Pepcid.  Please call if diarrhea worsens!  I will be thinking of you during this season and praying.  I hope you have a special time with your family.  I enjoyed seeing you again today! As you know, I value our relationship and want to provide genuine, compassionate, and quality care. I welcome your feedback. If you receive a survey regarding your visit,  I greatly appreciate you taking time to fill this out. See you next time!  Annitta Needs, PhD, ANP-BC [facility]

## 2021-08-19 NOTE — Progress Notes (Signed)
Referring Provider: Celene Squibb, MD Primary Care Physician:  Celene Squibb, MD Primary GI: Dr. Abbey Chatters  Chief Complaint  Patient presents with   Cirrhosis   Diarrhea    May be little better. Entocort caused fluid retention    HPI:   Chelsea Estrada is a 55 y.o. female presenting today with a history of cirrhosis due to fatty liver, diarrhea, recent colonoscopy Aug 2022 with chronic lymphocytic colitis. 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 Surveillance due in 2025. Unable to tolerate entocort due to reports of swelling. Recommended Pepto bismol.    Taking pepto bismol several times a week. Diarrhea has improved significantly. She has been taking omeprazole but has no GERD symptoms. She was not aware she needed to stop this if possible. No abdominal pain, N/V, jaundice, pruritis, mental status changes.  Her husband has lung cancer and has moved to hospice status today. She spent most of the visit discussing this difficult time and feels stable from a GI standpoint.    US abdomen due now.   Past Medical History:  Diagnosis Date   Family history of adverse reaction to anesthesia    mother-- PONV   Frequency of urination    History of kidney stones    Hypertension    Hypothyroidism    Lupus ([place])    effects joints--  rheumologist-  dr Amil Amen w/ [facility]   Right ureteral stone    Type 2 diabetes mellitus ([place])    Urgency of urination    Wears glasses     Past Surgical History:  Procedure Laterality Date   BIOPSY  02/17/2018   Procedure: BIOPSY;  Surgeon: Danie Binder, MD;  [facility];  Service: Endoscopy;;  duodenum;   BIOPSY  04/21/2021   Procedure: BIOPSY;  Surgeon: [name], DO;  [facility];  Service: Endoscopy;;  gastric colon   CHOLECYSTECTOMY OPEN  age 56's   COLONOSCOPY N/A 04/10/2018   recto-sigmoid diverticulosis, torturous left colon, external/internal hemorrhoids   COLONOSCOPY WITH  PROPOFOL N/A 04/21/2021   non-bleeding internal hemorrhoid, sigmoid and descending colon diverticulosis, random colonic biopsies consistent with chronic lymphocytic colitis.   CYSTOSCOPY WITH RETROGRADE PYELOGRAM, URETEROSCOPY AND STENT PLACEMENT Right 04/04/2015   Procedure: CYSTOSCOPY WITH RETROGRADE PYELOGRAM, URETEROSCOPY AND STENT PLACEMENT;  Surgeon: Alexis Frock, MD;  Location: [facility];  Service: Urology;  Laterality: Right;   ESOPHAGOGASTRODUODENOSCOPY N/A 08/25/2015   Grade 1 varices, patent Schatzki's ring, moderate erosive gastritis.   ESOPHAGOGASTRODUODENOSCOPY N/A 02/17/2018   non-obstructing Schatzki's ring, non-bleeding diverticulum in second portion of duodenum, erosive gastritis   ESOPHAGOGASTRODUODENOSCOPY (EGD) WITH PROPOFOL N/A 04/21/2021   Grade 1 varices, portal gastropathy, gastritis. Surveillance in 3 years. Negative H.pylori.   HOLMIUM LASER APPLICATION Right 67/89/3810   Procedure: HOLMIUM LASER APPLICATION;  Surgeon: Alexis Frock, MD;  Location: [facility];  Service: Urology;  Laterality: Right;    Current Outpatient Medications  Medication Sig Dispense Refill   acetaminophen (TYLENOL) 500 MG tablet Take 1,000 mg by mouth every 6 (six) hours as needed for moderate pain.     ALPRAZolam (XANAX) 0.25 MG tablet Take 0.25 mg by mouth daily as needed for anxiety.     atorvastatin (LIPITOR) 10 MG tablet Take 10 mg by mouth daily.      augmented betamethasone dipropionate (DIPROLENE-AF) 0.05 % cream Apply 1 application topically See admin instructions. Apply once daily, may apply up to 4 times a  day as needed for psoriasis     bismuth subsalicylate (PEPTO BISMOL) 262 MG chewable tablet Chew 3 tablets by mouth as needed.     Cholecalciferol (VITAMIN D) 50 MCG (2000 UT) tablet Take 2,000 Units by mouth daily.     diphenhydrAMINE (BENADRYL) 25 MG tablet Take 25 mg by mouth daily as needed for allergies.     fenofibrate 160 MG tablet Take  160 mg by mouth at bedtime.     HYDROcodone-acetaminophen (NORCO/VICODIN) 5-325 MG tablet Take 1 tablet by mouth every 6 (six) hours as needed for moderate pain. For lupus     hydroxychloroquine (PLAQUENIL) 200 MG tablet Take 200 mg by mouth 2 (two) times daily.      Krill Oil 500 MG CAPS Take 500 mg by mouth in the morning and at bedtime.     levothyroxine (SYNTHROID, LEVOTHROID) 25 MCG tablet Take 25 mcg by mouth daily before breakfast.     lisinopril (PRINIVIL,ZESTRIL) 5 MG tablet Take 5 mg by mouth every morning.     magnesium oxide (MAG-OX) 400 MG tablet Take 400 mg by mouth daily.     metFORMIN (GLUCOPHAGE-XR) 500 MG 24 hr tablet Take 1,000 mg by mouth 2 (two) times daily.     metoprolol succinate (TOPROL-XL) 50 MG 24 hr tablet Take 50 mg by mouth every morning. Take with or immediately following a meal.     Milk Thistle 1000 MG CAPS Take 1,000 mg by mouth daily.      Multiple Minerals-Vitamins (CAL MAG ZINC +D3) TABS Take 1 tablet by mouth daily.     omeprazole (PRILOSEC) 20 MG capsule Take 1 capsule (20 mg total) by mouth daily. 90 capsule 3   Pediatric Multivitamins-Iron (FLINTSTONES COMPLETE PO) Take 1 tablet by mouth daily.     Polyethyl Glycol-Propyl Glycol (SYSTANE OP) Place 1 drop into both eyes daily as needed (dry eyes).     Probiotic Product (PROBIOTIC PO) Take 1 capsule by mouth daily.     tiZANidine (ZANAFLEX) 4 MG tablet Take 4 mg by mouth at bedtime.     TREMFYA 100 MG/ML SOPN Inject 1 Dose into the skin every 8 (eight) weeks.     No current facility-administered medications for this visit.    Allergies as of 08/19/2021 - Review Complete 08/19/2021  Allergen Reaction Noted   Penicillins Hives 10/26/2010   Doxycycline Rash 07/18/2017   Sulfamethoxazole-trimethoprim Other (See Comments) and Rash 09/18/2019    Family History  Problem Relation Age of Onset   Liver disease Mother    Diabetes type II Mother    Hypertension Mother    Diabetes Mellitus II Father     Hypertension Father    Heart disease Father    Colon cancer Neg Hx     Social History   Socioeconomic History   Marital status: Married    Spouse name: Not on file   Number of children: Not on file   Years of education: Not on file   Highest education level: Not on file  Occupational History   Not on file  Tobacco Use   Smoking status: Every Day    Packs/day: 1.00    Years: 0.00    Pack years: 0.00    Types: Cigarettes   Smokeless tobacco: Never   Tobacco comments:    one pack daily  Substance and Sexual Activity   Alcohol use: Yes    Alcohol/week: 0.0 standard drinks    Comment: Very rarely: typically once every 6+ months; typically  1 drink per occurrence.   Drug use: No   Sexual activity: Not on file  Other Topics Concern   Not on file  Social History Narrative   Not on file   Social Determinants of Health   Financial Resource Strain: Not on file  Food Insecurity: Not on file  Transportation Estrada: Not on file  Physical Activity: Not on file  Stress: Not on file  Social Connections: Not on file    Review of Systems: Gen: Denies fever, chills, anorexia. Denies fatigue, weakness, weight loss.  CV: Denies chest pain, palpitations, syncope, peripheral edema, and claudication. Resp: Denies dyspnea at rest, cough, wheezing, coughing up blood, and pleurisy. GI: see HPI Derm: Denies rash, itching, dry skin Psych: Denies depression, anxiety, memory loss, confusion. No homicidal or suicidal ideation.  Heme: Denies bruising, bleeding, and enlarged lymph nodes.  Physical Exam: BP 124/60    Pulse 78    Temp (!) 96.6 F (35.9 C) (Temporal)    Ht _0  (1.6 m)    Wt 204 lb 6.4 oz (92.7 kg)    BMI 36.21 kg/m  General:   Alert and oriented. No distress noted. Pleasant and cooperative.  Head:  Normocephalic and atraumatic. Eyes:  Conjuctiva clear without scleral icterus. Mouth:  mask in place Abdomen:  +BS, soft, non-tender and non-distended. No rebound or guarding. No  HSM or masses noted. Msk:  Symmetrical without gross deformities. Normal posture. Extremities:  Without edema. Neurologic:  Alert and  oriented x4 Psych:  Alert and cooperative. Normal mood and affect.  Received outside labs dated 04/15/21:   Hep B surface antibody negative.  Hep A antibody negative Hep B surface antigen negative Hep B core negative HCV antibody negative Negative celiac serologies Vit D 42 AMA negative TSH 4.4 ANA negative INR 1.1. IgA, IgM, IgG all normal Creatinine 0.94, BUN 12 Tbili 0.6, Alk Phos 60, AST 31, ALT 14 Hgb 12.6, Hct 37.6, platelets 94       ASSESSMENT: INARA DIKE is a 55 y.o. female presenting today  with a history of cirrhosis due to fatty liver, diarrhea, recent colonoscopy Aug 2022 with chronic lymphocytic colitis. 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 Surveillance due in 2025.   Lymphocytic colitis: unable to tolerate entocort due to lower extremity edema. She has responded well to off-label pepto. She has returned to baseline intermittent diarrhea bowel habits. No symptomatic GERD. 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 May take Pepcid if needed.   Cirrhosis: well compensated at this time. Due for [place] now.     PLAN:  US abdomen now Stop PPI Call if recurrent diarrhea Return in 6 months  Chelsea Needs, PhD, [facility]

## 2021-08-20 ENCOUNTER — Encounter: Payer: Self-pay | Admitting: Gastroenterology

## 2021-09-01 ENCOUNTER — Ambulatory Visit (HOSPITAL_COMMUNITY)
Admission: RE | Admit: 2021-09-01 | Discharge: 2021-09-01 | Disposition: A | Discharge status: Home / Self Care | Payer: BC Managed Care – PPO | Source: Ambulatory Visit | Attending: Gastroenterology | Admitting: Gastroenterology

## 2021-09-01 ENCOUNTER — Other Ambulatory Visit: Payer: Self-pay

## 2021-09-01 DIAGNOSIS — K7460 Unspecified cirrhosis of liver: Secondary | ICD-10-CM | POA: Insufficient documentation

## 2021-11-25 ENCOUNTER — Encounter: Payer: Self-pay | Admitting: Internal Medicine

## 2022-01-21 ENCOUNTER — Telehealth: Payer: Self-pay | Admitting: Internal Medicine

## 2022-01-21 NOTE — Telephone Encounter (Signed)
Chelsea Estrada see previous US result note. Patient due for 6 months, but requested if it could be done every 12 months d/t insurance cost.

## 2022-01-21 NOTE — Telephone Encounter (Signed)
Recall for ultrasound 

## 2022-01-25 NOTE — Telephone Encounter (Signed)
We can put on recall for Dec 2023 at her request.

## 2022-02-18 ENCOUNTER — Ambulatory Visit: Payer: BC Managed Care – PPO | Admitting: Gastroenterology

## 2022-03-11 ENCOUNTER — Ambulatory Visit: Payer: BC Managed Care – PPO | Admitting: Gastroenterology

## 2022-03-11 ENCOUNTER — Encounter: Payer: Self-pay | Admitting: Gastroenterology

## 2022-03-11 VITALS — BP 106/70 | HR 85 | Temp 97.3°F | Ht 63.0 in | Wt 197.6 lb

## 2022-03-11 DIAGNOSIS — K52839 Microscopic colitis, unspecified: Secondary | ICD-10-CM | POA: Diagnosis not present

## 2022-03-11 DIAGNOSIS — K7460 Unspecified cirrhosis of liver: Secondary | ICD-10-CM

## 2022-03-11 MED ORDER — DICYCLOMINE HCL 10 MG PO CAPS: 10.0000 mg | ORAL_CAPSULE | Freq: Three times a day (TID) | ORAL | 3 refills | Status: DC

## 2022-03-11 NOTE — Progress Notes (Signed)
Gastroenterology Office Note     Primary Care Physician:  Celene Squibb, MD  Primary Gastroenterologist: Dr. Abbey Chatters   Chief Complaint   Chief Complaint  Patient presents with   Follow-up    Follow up on colitis     History of Present Illness   Chelsea Estrada is a 56 y.o. 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 Surveillance due in 2025. Unable to tolerate entocort due to reports of swelling.   [place] due now but only wants yearly.  Completed Hep A and B vaccinations in interim from last appt.   Feels like pepto made her BP go up. Has good and bad days with loose stools.   No GERD. Her husband passed away 28-Aug-2021. She has good days and bad days. Majority of visit talked about life since her husband passed away. No mental status changes or confusion.  Recent labs completed by PCP dated June 2023: Hgb 13, Hct 37.8, platelets 83, creatinine 1.09, Tbili 0.4, ALk Phos 73, AST 19, ALT 15, a1c 6.2   Past Medical History:  Diagnosis Date   Family history of adverse reaction to anesthesia    mother-- PONV   Frequency of urination    History of kidney stones    Hypertension    Hypothyroidism    Lupus ([place])    effects joints--  rheumologist-  dr Amil Amen w/ [facility]   Right ureteral stone    Type 2 diabetes mellitus ([place])    Urgency of urination    Wears glasses     Past Surgical History:  Procedure Laterality Date   BIOPSY  02/17/2018   Procedure: BIOPSY;  Surgeon: Danie Binder, MD;  [facility];  Service: Endoscopy;;  duodenum;   BIOPSY  04/21/2021   Procedure: BIOPSY;  Surgeon: [name], DO;  [facility];  Service: Endoscopy;;  gastric colon   CHOLECYSTECTOMY OPEN  age 75's   COLONOSCOPY N/A 04/10/2018   recto-sigmoid diverticulosis, torturous  left colon, external/internal hemorrhoids   COLONOSCOPY WITH PROPOFOL N/A 04/21/2021   non-bleeding internal hemorrhoid, sigmoid and descending colon diverticulosis, random colonic biopsies consistent with chronic lymphocytic colitis.   CYSTOSCOPY WITH RETROGRADE PYELOGRAM, URETEROSCOPY AND STENT PLACEMENT Right 04/04/2015   Procedure: CYSTOSCOPY WITH RETROGRADE PYELOGRAM, URETEROSCOPY AND STENT PLACEMENT;  Surgeon: Alexis Frock, MD;  Location: [facility];  Service: Urology;  Laterality: Right;   ESOPHAGOGASTRODUODENOSCOPY N/A 08/25/2015   Grade 1 varices, patent Schatzki's ring, moderate erosive gastritis.   ESOPHAGOGASTRODUODENOSCOPY N/A 02/17/2018   non-obstructing Schatzki's ring, non-bleeding diverticulum in second portion of duodenum, erosive gastritis   ESOPHAGOGASTRODUODENOSCOPY (EGD) WITH PROPOFOL N/A 04/21/2021   Grade 1 varices, portal gastropathy, gastritis. Surveillance in 3 years. Negative H.pylori.   HOLMIUM LASER APPLICATION Right 25/00/3704   Procedure: HOLMIUM LASER APPLICATION;  Surgeon: Alexis Frock, MD;  Location: [facility];  Service: Urology;  Laterality: Right;    Current Outpatient Medications  Medication Sig Dispense Refill   acetaminophen (TYLENOL) 500 MG tablet Take 1,000 mg by mouth every 6 (six) hours as needed for moderate pain.     ALPRAZolam (XANAX) 0.25 MG tablet Take 0.25 mg by mouth daily as needed for anxiety.     atorvastatin (LIPITOR) 10 MG tablet Take 10 mg by mouth daily.  augmented betamethasone dipropionate (DIPROLENE-AF) 0.05 % cream Apply 1 application topically See admin instructions. Apply once daily, may apply up to 4 times a day as needed for psoriasis     Cholecalciferol (VITAMIN D) 50 MCG (2000 UT) tablet Take 2,000 Units by mouth daily.     dicyclomine (BENTYL) 10 MG capsule Take 1 capsule (10 mg total) by mouth 3 (three) times daily before meals. For frequent stools 90 capsule 3   diphenhydrAMINE  (BENADRYL) 25 MG tablet Take 25 mg by mouth daily as needed for allergies.     fenofibrate 160 MG tablet Take 160 mg by mouth at bedtime.     HYDROcodone-acetaminophen (NORCO/VICODIN) 5-325 MG tablet Take 1 tablet by mouth every 6 (six) hours as needed for moderate pain. For lupus     hydroxychloroquine (PLAQUENIL) 200 MG tablet Take 200 mg by mouth 2 (two) times daily.      Krill Oil 500 MG CAPS Take 500 mg by mouth in the morning and at bedtime.     levothyroxine (SYNTHROID, LEVOTHROID) 25 MCG tablet Take 25 mcg by mouth daily before breakfast.     lisinopril (PRINIVIL,ZESTRIL) 5 MG tablet Take 5 mg by mouth every morning.     magnesium oxide (MAG-OX) 400 MG tablet Take 400 mg by mouth daily.     metFORMIN (GLUCOPHAGE-XR) 500 MG 24 hr tablet Take 1,000 mg by mouth 2 (two) times daily.     metoprolol succinate (TOPROL-XL) 50 MG 24 hr tablet Take 50 mg by mouth every morning. Take with or immediately following a meal.     Milk Thistle 1000 MG CAPS Take 1,000 mg by mouth daily.      Multiple Minerals-Vitamins (CAL MAG ZINC +D3) TABS Take 1 tablet by mouth daily.     Pediatric Multivitamins-Iron (FLINTSTONES COMPLETE PO) Take 1 tablet by mouth daily.     Polyethyl Glycol-Propyl Glycol (SYSTANE OP) Place 1 drop into both eyes daily as needed (dry eyes).     Probiotic Product (PROBIOTIC PO) Take 1 capsule by mouth daily.     tiZANidine (ZANAFLEX) 4 MG tablet Take 4 mg by mouth at bedtime.     TREMFYA 100 MG/ML SOPN Inject 1 Dose into the skin every 8 (eight) weeks.     No current facility-administered medications for this visit.    Allergies as of 03/11/2022 - Review Complete 03/11/2022  Allergen Reaction Noted   Prednisone Hypertension 03/11/2022   Penicillins Hives 10/26/2010   Doxycycline Rash 07/18/2017   Sulfamethoxazole-trimethoprim Other (See Comments) and Rash 09/18/2019    Family History  Problem Relation Age of Onset   Liver disease Mother    Diabetes type II Mother     Hypertension Mother    Diabetes Mellitus II Father    Hypertension Father    Heart disease Father    Colon cancer Neg Hx     Social History   Socioeconomic History   Marital status: Married    Spouse name: Not on file   Number of children: Not on file   Years of education: Not on file   Highest education level: Not on file  Occupational History   Not on file  Tobacco Use   Smoking status: Every Day    Packs/day: 1.00    Years: 0.00    Total pack years: 0.00    Types: Cigarettes   Smokeless tobacco: Never   Tobacco comments:    one pack daily  Substance and Sexual Activity   Alcohol use: Yes  Alcohol/week: 0.0 standard drinks of alcohol    Comment: Very rarely: typically once every 6+ months; typically 1 drink per occurrence.   Drug use: No   Sexual activity: Not on file  Other Topics Concern   Not on file  Social History Narrative   Not on file   Social Determinants of Health   Financial Resource Strain: Not on file  Food Insecurity: Not on file  Transportation Estrada: Not on file  Physical Activity: Not on file  Stress: Not on file  Social Connections: Not on file  Intimate Partner Violence: Not on file     Review of Systems   Gen: Denies any fever, chills, fatigue, weight loss, lack of appetite.  CV: Denies chest pain, heart palpitations, peripheral edema, syncope.  Resp: Denies shortness of breath at rest or with exertion. Denies wheezing or cough.  GI: see HPI GU : Denies urinary burning, urinary frequency, urinary hesitancy MS: Denies joint pain, muscle weakness, cramps, or limitation of movement.  Derm: Denies rash, itching, dry skin Psych: Denies depression, anxiety, memory loss, and confusion Heme: Denies bruising, bleeding, and enlarged lymph nodes.   Physical Exam   BP 106/70   Pulse 85   Temp (!) 97.3 F (36.3 C)   Ht _0  (1.6 m)   Wt 197 lb 9.6 oz (89.6 kg)   BMI 35.00 kg/m  General:   Alert and oriented. Pleasant and cooperative.  Well-nourished and well-developed.  Head:  Normocephalic and atraumatic. Eyes:  Without icterus Abdomen:  +BS, soft, non-tender and non-distended. Liver margin palpable to left of midline Rectal:  Deferred  Msk:  Symmetrical without gross deformities. Normal posture. Extremities:  Without edema. Neurologic:  Alert and  oriented x4;  grossly normal neurologically. Skin:  Intact without significant lesions or rashes. Psych:  Alert and cooperative. Normal mood and affect.   Assessment   DAMARIZ PAGANELLI is a 56 y.o. 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 Surveillance due in 2025. Unable to tolerate entocort due to reports of swelling.   Cirrhosis: remaining well-compensated. Desires yearly ultrasounds. Will have next in Dec 2023. Recent labs reviewed. Will need to check INR and AFP with next set of labs.   Lymphocytic colitis: intermittent diarrhea. Did not tolerate entocort or pepto. Would like to avoid prednisone. Will trial supportive measures of dicyclomine.     PLAN  Dicyclomine 5 mg TID before meals: call with update. Discussed side effects to monitor [place] in Dec 2023 per request Check INR and AFP with next set of labs Return in 3 months to follow-up on microscopic colitis    Chelsea Needs, PhD, ANP-BC [facility]

## 2022-03-11 NOTE — Patient Instructions (Signed)
I have sent in dicyclomine to take three times a day before meals for loose stools. Monitor for constipation, dry mouth, dizziness, and confusion.  We will do an ultrasound in December 2023!  Thank you for bringing the blood work.  I will see you in 3 months!  I enjoyed seeing you again today! As you know, I value our relationship and want to provide genuine, compassionate, and quality care. I welcome your feedback. If you receive a survey regarding your visit,  I greatly appreciate you taking time to fill this out. See you next time!  Annitta Needs, PhD, ANP-BC [facility]

## 2022-06-16 ENCOUNTER — Encounter: Payer: Self-pay | Admitting: Gastroenterology

## 2022-06-16 ENCOUNTER — Ambulatory Visit (INDEPENDENT_AMBULATORY_CARE_PROVIDER_SITE_OTHER): Payer: BC Managed Care – PPO | Admitting: Gastroenterology

## 2022-06-16 VITALS — BP 123/76 | HR 70 | Temp 98.0°F | Ht 63.0 in | Wt 192.8 lb

## 2022-06-16 DIAGNOSIS — K7460 Unspecified cirrhosis of liver: Secondary | ICD-10-CM | POA: Diagnosis not present

## 2022-06-16 DIAGNOSIS — K52839 Microscopic colitis, unspecified: Secondary | ICD-10-CM | POA: Diagnosis not present

## 2022-06-16 NOTE — Progress Notes (Signed)
Gastroenterology Office Note     Primary Care Physician:  Celene Squibb, MD  Primary Gastroenterologist: Dr. Abbey Chatters   Chief Complaint   Chief Complaint  Patient presents with   Follow-up    Patient here today for a follow up on Hepatic cirrhosis and says she also has colitis. Patient states she is having issue diarrhea and is taking dicyclomine 10 mg Tid. Patient states the dicyclomine is of no help. Patient denies any other current gi issues.     History of Present Illness   Chelsea Estrada is a 56 y.o. 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 Surveillance due in 2025. Unable to tolerate entocort due to reports of swelling. States pepto increased BP.   She desires [place] only yearly. Completed Hep A/B vaccinations. [place] due in Dec 2023. Labs done thru PCP, which we will request.   Has multiple loose stools a day, unchanged. Some nocturnal episodes intermittently. Taking dicyclomine BID when eating but will take more often. Only slight improvement but not significant. Imodium helps more in evening. Creon didn't help. No recent antibiotics.   Chronically on metformin.     Past Medical History:  Diagnosis Date   Family history of adverse reaction to anesthesia    mother-- PONV   Frequency of urination    History of kidney stones    Hypertension    Hypothyroidism    Lupus ([place])    effects joints--  rheumologist-  dr Amil Amen w/ [facility]   Right ureteral stone    Type 2 diabetes mellitus ([place])    Urgency of urination    Wears glasses     Past Surgical History:  Procedure Laterality Date   BIOPSY  02/17/2018   Procedure: BIOPSY;  Surgeon: Danie Binder, MD;  [facility];  Service: Endoscopy;;  duodenum;   BIOPSY  04/21/2021   Procedure: BIOPSY;  Surgeon: [name], DO;   [facility];  Service: Endoscopy;;  gastric colon   CHOLECYSTECTOMY OPEN  age 29's   COLONOSCOPY N/A 04/10/2018   recto-sigmoid diverticulosis, torturous left colon, external/internal hemorrhoids   COLONOSCOPY WITH PROPOFOL N/A 04/21/2021   non-bleeding internal hemorrhoid, sigmoid and descending colon diverticulosis, random colonic biopsies consistent with chronic lymphocytic colitis.   CYSTOSCOPY WITH RETROGRADE PYELOGRAM, URETEROSCOPY AND STENT PLACEMENT Right 04/04/2015   Procedure: CYSTOSCOPY WITH RETROGRADE PYELOGRAM, URETEROSCOPY AND STENT PLACEMENT;  Surgeon: Alexis Frock, MD;  Location: [facility];  Service: Urology;  Laterality: Right;   ESOPHAGOGASTRODUODENOSCOPY N/A 08/25/2015   Grade 1 varices, patent Schatzki's ring, moderate erosive gastritis.   ESOPHAGOGASTRODUODENOSCOPY N/A 02/17/2018   non-obstructing Schatzki's ring, non-bleeding diverticulum in second portion of duodenum, erosive gastritis   ESOPHAGOGASTRODUODENOSCOPY (EGD) WITH PROPOFOL N/A 04/21/2021   Grade 1 varices, portal gastropathy, gastritis. Surveillance in 3 years. Negative H.pylori.   HOLMIUM LASER APPLICATION Right 21/19/4174   Procedure: HOLMIUM LASER APPLICATION;  Surgeon: Alexis Frock, MD;  Location: [facility];  Service: Urology;  Laterality: Right;    Current Outpatient Medications  Medication Sig Dispense Refill   acetaminophen (TYLENOL) 500 MG tablet Take 1,000 mg by mouth every 6 (six) hours as needed for moderate pain.     ALPRAZolam (XANAX) 0.25 MG tablet Take 0.25 mg by mouth daily as needed for anxiety.     atorvastatin (LIPITOR)  10 MG tablet Take 10 mg by mouth daily.      augmented betamethasone dipropionate (DIPROLENE-AF) 0.05 % cream Apply 1 application topically See admin instructions. Apply once daily, may apply up to 4 times a day as needed for psoriasis     Cholecalciferol (VITAMIN D) 50 MCG (2000 UT) tablet Take 2,000 Units by mouth daily.      dicyclomine (BENTYL) 10 MG capsule Take 1 capsule (10 mg total) by mouth 3 (three) times daily before meals. For frequent stools 90 capsule 3   diphenhydrAMINE (BENADRYL) 25 MG tablet Take 25 mg by mouth daily as needed for allergies.     fenofibrate 160 MG tablet Take 160 mg by mouth at bedtime.     HYDROcodone-acetaminophen (NORCO/VICODIN) 5-325 MG tablet Take 1 tablet by mouth every 6 (six) hours as needed for moderate pain. For lupus     hydroxychloroquine (PLAQUENIL) 200 MG tablet Take 200 mg by mouth 2 (two) times daily.      Krill Oil 500 MG CAPS Take 500 mg by mouth in the morning and at bedtime.     levothyroxine (SYNTHROID, LEVOTHROID) 25 MCG tablet Take 25 mcg by mouth daily before breakfast.     lisinopril (PRINIVIL,ZESTRIL) 5 MG tablet Take 5 mg by mouth every morning.     magnesium oxide (MAG-OX) 400 MG tablet Take 400 mg by mouth daily.     metFORMIN (GLUCOPHAGE-XR) 500 MG 24 hr tablet Take 1,000 mg by mouth 2 (two) times daily.     metoprolol succinate (TOPROL-XL) 50 MG 24 hr tablet Take 50 mg by mouth every morning. Take with or immediately following a meal.     Milk Thistle 1000 MG CAPS Take 1,000 mg by mouth daily.      Polyethyl Glycol-Propyl Glycol (SYSTANE OP) Place 1 drop into both eyes daily as needed (dry eyes).     Probiotic Product (PROBIOTIC PO) Take 1 capsule by mouth daily.     tiZANidine (ZANAFLEX) 4 MG tablet Take 4 mg by mouth at bedtime.     TREMFYA 100 MG/ML SOPN Inject 1 Dose into the skin every 8 (eight) weeks.     No current facility-administered medications for this visit.    Allergies as of 06/16/2022 - Review Complete 06/16/2022  Allergen Reaction Noted   Prednisone Hypertension 03/11/2022   Penicillins Hives 10/26/2010   Doxycycline Rash 07/18/2017   Sulfamethoxazole-trimethoprim Other (See Comments) and Rash 09/18/2019    Family History  Problem Relation Age of Onset   Liver disease Mother    Diabetes type II Mother    Hypertension Mother     Diabetes Mellitus II Father    Hypertension Father    Heart disease Father    Colon cancer Neg Hx     Social History   Socioeconomic History   Marital status: Married    Spouse name: Not on file   Number of children: Not on file   Years of education: Not on file   Highest education level: Not on file  Occupational History   Not on file  Tobacco Use   Smoking status: Every Day    Packs/day: 1.00    Years: 0.00    Total pack years: 0.00    Types: Cigarettes   Smokeless tobacco: Never   Tobacco comments:    one pack daily  Vaping Use   Vaping Use: Never used  Substance and Sexual Activity   Alcohol use: Yes    Alcohol/week: 0.0 standard drinks of alcohol  Comment: Very rarely: typically once every 6+ months; typically 1 drink per occurrence.   Drug use: No   Sexual activity: Not on file  Other Topics Concern   Not on file  Social History Narrative   Not on file   Social Determinants of Health   Financial Resource Strain: Not on file  Food Insecurity: Not on file  Transportation Needs: Not on file  Physical Activity: Not on file  Stress: Not on file  Social Connections: Not on file  Intimate Partner Violence: Not on file     Review of Systems   Gen: Denies any fever, chills, fatigue, weight loss, lack of appetite.  CV: Denies chest pain, heart palpitations, peripheral edema, syncope.  Resp: Denies shortness of breath at rest or with exertion. Denies wheezing or cough.  GI: see HPI GU : Denies urinary burning, urinary frequency, urinary hesitancy MS: Denies joint pain, muscle weakness, cramps, or limitation of movement.  Derm: Denies rash, itching, dry skin Psych: Denies depression, anxiety, memory loss, and confusion Heme: Denies bruising, bleeding, and enlarged lymph nodes.   Physical Exam   BP 123/76 (BP Location: Left Arm, Patient Position: Sitting, Cuff Size: Large)   Pulse 70   Temp 98 F (36.7 C) (Oral)   Ht '5\' 3"'$  (1.6 m)   Wt 192 lb 12.8  oz (87.5 kg)   BMI 34.15 kg/m  General:   Alert and oriented. Pleasant and cooperative. Well-nourished and well-developed.  Head:  Normocephalic and atraumatic. Eyes:  Without icterus Abdomen:  +BS, soft, non-tender and non-distended. No HSM noted. No guarding or rebound. No masses appreciated.  Rectal:  Deferred  Msk:  Symmetrical without gross deformities. Normal posture. Extremities:  Without edema. Neurologic:  Alert and  oriented x4;  grossly normal neurologically. Skin:  Intact without significant lesions or rashes. Psych:  Alert and cooperative. Normal mood and affect.   Assessment   AIZLYNN DIGILIO is a 56 y.o. 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   Cirrhosis: well-compensated. Desires yearly ultrasounds instead of every 6 months. When insurance updates, she is willing to do every 6 months. Plan for Dec 2023. Will request outside labs. Will need INR, AFP if not included in labs from PCP (along with CBC, CMP). EGD surveillance due in 2025.   Diarrhea: due to chronic lymphocytic colitis. Has been difficult to treat as intolerant to Entocort and pepto. Slight improvement with supportive measures using dicyclomine. Will trial Imodium daily to BID if needed. I have asked she also discuss with PCP a drug holiday off of metformin, as this could be contributing to chronic diarrhea.    PLAN   Trial of imodium [place] in Dec 2023 Obtain labs from PCP (need CBC, CMP, INR, AFP) Return in 3 months   Annitta Needs, PhD, [facility]

## 2022-06-16 NOTE — Patient Instructions (Signed)
I recommend taking Imodium first thing in the morning. You can take in evening if needed.  Please talk to your PCP about taking a break from metformin to see if this helps.  We will do an ultrasound in December.  I will review your labs from your primary care and see if anything extra is needed.  Please call if no improvement!  We will see you in 3 months!  I enjoyed seeing you again today! As you know, I value our relationship and want to provide genuine, compassionate, and quality care. I welcome your feedback. If you receive a survey regarding your visit,  I greatly appreciate you taking time to fill this out. See you next time!  Annitta Needs, PhD, ANP-BC [facility]

## 2022-07-14 ENCOUNTER — Telehealth: Payer: Self-pay | Admitting: Internal Medicine

## 2022-07-14 NOTE — Telephone Encounter (Signed)
Needs to be scheduled RUQ [place] (recall)

## 2022-07-15 NOTE — Telephone Encounter (Signed)
Recall sent 

## 2022-09-08 ENCOUNTER — Other Ambulatory Visit (HOSPITAL_COMMUNITY): Payer: Self-pay | Admitting: Family Medicine

## 2022-09-08 DIAGNOSIS — Z1231 Encounter for screening mammogram for malignant neoplasm of breast: Secondary | ICD-10-CM

## 2022-09-16 ENCOUNTER — Encounter: Payer: Self-pay | Admitting: Internal Medicine

## 2022-09-16 ENCOUNTER — Ambulatory Visit: Payer: BC Managed Care – PPO | Admitting: Internal Medicine

## 2022-09-16 VITALS — BP 114/63 | HR 79 | Temp 97.1°F | Ht 63.0 in | Wt 187.0 lb

## 2022-09-16 DIAGNOSIS — K52832 Lymphocytic colitis: Secondary | ICD-10-CM

## 2022-09-16 DIAGNOSIS — K7460 Unspecified cirrhosis of liver: Secondary | ICD-10-CM

## 2022-09-16 DIAGNOSIS — R197 Diarrhea, unspecified: Secondary | ICD-10-CM | POA: Diagnosis not present

## 2022-09-16 DIAGNOSIS — I8510 Secondary esophageal varices without bleeding: Secondary | ICD-10-CM | POA: Diagnosis not present

## 2022-09-16 NOTE — Progress Notes (Signed)
Referring Provider: Celene Squibb, MD Primary Care Physician:  Celene Squibb, MD Primary GI:  Dr. Abbey Chatters  Chief Complaint  Patient presents with   Follow-up    Pt here for her 3 month follow up.    HPI:   Chelsea Estrada is a 57 y.o. 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 Surveillance due in 2025.   Unable to tolerate budesonide due to reports of swelling. States pepto increased BP.    She desires [place] only yearly. Completed Hep A/B vaccinations. [place] 09/01/21 without hepatoma.    Has multiple loose stools a day, unchanged. Some nocturnal episodes intermittently. Taking dicyclomine BID when eating but will take more often. Only slight improvement but not significant. Imodium helps more in evening. Creon didn't help. No recent antibiotics.    Chronically on metformin.   Past Medical History:  Diagnosis Date   Family history of adverse reaction to anesthesia    mother-- PONV   Frequency of urination    History of kidney stones    Hypertension    Hypothyroidism    Lupus ([place])    effects joints--  rheumologist-  dr Amil Amen w/ [facility]   Right ureteral stone    Type 2 diabetes mellitus ([place])    Urgency of urination    Wears glasses     Past Surgical History:  Procedure Laterality Date   BIOPSY  02/17/2018   Procedure: BIOPSY;  Surgeon: Danie Binder, MD;  [facility];  Service: Endoscopy;;  duodenum;   BIOPSY  04/21/2021   Procedure: BIOPSY;  Surgeon: [name], DO;  [facility];  Service: Endoscopy;;  gastric colon   CHOLECYSTECTOMY OPEN  age 48's   COLONOSCOPY N/A 04/10/2018   recto-sigmoid diverticulosis, torturous left colon, external/internal hemorrhoids   COLONOSCOPY WITH PROPOFOL N/A 04/21/2021   non-bleeding internal hemorrhoid, sigmoid and descending  colon diverticulosis, random colonic biopsies consistent with chronic lymphocytic colitis.   CYSTOSCOPY WITH RETROGRADE PYELOGRAM, URETEROSCOPY AND STENT PLACEMENT Right 04/04/2015   Procedure: CYSTOSCOPY WITH RETROGRADE PYELOGRAM, URETEROSCOPY AND STENT PLACEMENT;  Surgeon: Alexis Frock, MD;  Location: [facility];  Service: Urology;  Laterality: Right;   ESOPHAGOGASTRODUODENOSCOPY N/A 08/25/2015   Grade 1 varices, patent Schatzki's ring, moderate erosive gastritis.   ESOPHAGOGASTRODUODENOSCOPY N/A 02/17/2018   non-obstructing Schatzki's ring, non-bleeding diverticulum in second portion of duodenum, erosive gastritis   ESOPHAGOGASTRODUODENOSCOPY (EGD) WITH PROPOFOL N/A 04/21/2021   Grade 1 varices, portal gastropathy, gastritis. Surveillance in 3 years. Negative H.pylori.   HOLMIUM LASER APPLICATION Right 16/06/9603   Procedure: HOLMIUM LASER APPLICATION;  Surgeon: Alexis Frock, MD;  Location: [facility];  Service: Urology;  Laterality: Right;    Current Outpatient Medications  Medication Sig Dispense Refill   acetaminophen (TYLENOL) 500 MG tablet Take 1,000 mg by mouth every 6 (six) hours as needed for moderate pain.     ALPRAZolam (XANAX) 0.25 MG tablet Take 0.25 mg by mouth daily as needed for anxiety.     atorvastatin (LIPITOR) 10 MG tablet Take 10 mg by mouth daily.      augmented betamethasone dipropionate (DIPROLENE-AF) 0.05 % cream Apply 1 application topically See admin instructions. Apply once daily, may apply up to 4 times a day as needed for psoriasis     Cholecalciferol (VITAMIN D) 50 MCG (2000  UT) tablet Take 2,000 Units by mouth daily.     diphenhydrAMINE (BENADRYL) 25 MG tablet Take 25 mg by mouth daily as needed for allergies.     fenofibrate 160 MG tablet Take 160 mg by mouth at bedtime.     HYDROcodone-acetaminophen (NORCO/VICODIN) 5-325 MG tablet Take 1 tablet by mouth every 6 (six) hours as needed for moderate pain. For lupus      hydroxychloroquine (PLAQUENIL) 200 MG tablet Take 200 mg by mouth 2 (two) times daily.      Krill Oil 500 MG CAPS Take 500 mg by mouth in the morning and at bedtime.     levothyroxine (SYNTHROID, LEVOTHROID) 25 MCG tablet Take 25 mcg by mouth daily before breakfast.     lisinopril (PRINIVIL,ZESTRIL) 5 MG tablet Take 5 mg by mouth every morning.     magnesium oxide (MAG-OX) 400 MG tablet Take 400 mg by mouth daily.     metFORMIN (GLUCOPHAGE-XR) 500 MG 24 hr tablet Take 1,000 mg by mouth 2 (two) times daily.     metoprolol succinate (TOPROL-XL) 50 MG 24 hr tablet Take 50 mg by mouth every morning. Take with or immediately following a meal.     Milk Thistle 1000 MG CAPS Take 1,000 mg by mouth daily.      Polyethyl Glycol-Propyl Glycol (SYSTANE OP) Place 1 drop into both eyes daily as needed (dry eyes).     Probiotic Product (PROBIOTIC PO) Take 1 capsule by mouth daily.     tiZANidine (ZANAFLEX) 4 MG tablet Take 4 mg by mouth at bedtime.     TREMFYA 100 MG/ML SOPN Inject 1 Dose into the skin every 8 (eight) weeks.     dicyclomine (BENTYL) 10 MG capsule Take 1 capsule (10 mg total) by mouth 3 (three) times daily before meals. For frequent stools (Patient not taking: Reported on 09/16/2022) 90 capsule 3   No current facility-administered medications for this visit.    Allergies as of 09/16/2022 - Review Complete 09/16/2022  Allergen Reaction Noted   Prednisone Hypertension 03/11/2022   Penicillins Hives 10/26/2010   Doxycycline Rash 07/18/2017   Sulfamethoxazole-trimethoprim Other (See Comments) and Rash 09/18/2019    Family History  Problem Relation Age of Onset   Liver disease Mother    Diabetes type II Mother    Hypertension Mother    Diabetes Mellitus II Father    Hypertension Father    Heart disease Father    Colon cancer Neg Hx     Social History   Socioeconomic History   Marital status: Married    Spouse name: Not on file   Number of children: Not on file   Years of  education: Not on file   Highest education level: Not on file  Occupational History   Not on file  Tobacco Use   Smoking status: Every Day    Packs/day: 1.00    Years: 0.00    Total pack years: 0.00    Types: Cigarettes   Smokeless tobacco: Never   Tobacco comments:    one pack daily  Vaping Use   Vaping Use: Never used  Substance and Sexual Activity   Alcohol use: Yes    Alcohol/week: 0.0 standard drinks of alcohol    Comment: Very rarely: typically once every 6+ months; typically 1 drink per occurrence.   Drug use: No   Sexual activity: Not on file  Other Topics Concern   Not on file  Social History Narrative   Not on file  Social Determinants of Health   Financial Resource Strain: Not on file  Food Insecurity: Not on file  Transportation Needs: Not on file  Physical Activity: Not on file  Stress: Not on file  Social Connections: Not on file    Subjective: Review of Systems  Constitutional:  Negative for chills and fever.  HENT:  Negative for congestion and hearing loss.   Eyes:  Negative for blurred vision and double vision.  Respiratory:  Negative for cough and shortness of breath.   Cardiovascular:  Negative for chest pain and palpitations.  Gastrointestinal:  Positive for diarrhea. Negative for abdominal pain, blood in stool, constipation, heartburn, melena and vomiting.  Genitourinary:  Negative for dysuria and urgency.  Musculoskeletal:  Negative for joint pain and myalgias.  Skin:  Negative for itching and rash.  Neurological:  Negative for dizziness and headaches.  Psychiatric/Behavioral:  Negative for depression. The patient is not nervous/anxious.      Objective: BP 114/63   Pulse 79   Temp (!) 97.1 F (36.2 C)   Ht '5\' 3"'$  (1.6 m)   Wt 187 lb (84.8 kg)   BMI 33.13 kg/m  Physical Exam Constitutional:      Appearance: Normal appearance.  HENT:     Head: Normocephalic and atraumatic.  Eyes:     Extraocular Movements: Extraocular movements  intact.     Conjunctiva/sclera: Conjunctivae normal.  Cardiovascular:     Rate and Rhythm: Normal rate and regular rhythm.  Pulmonary:     Effort: Pulmonary effort is normal.     Breath sounds: Normal breath sounds.  Abdominal:     General: Bowel sounds are normal.     Palpations: Abdomen is soft.  Musculoskeletal:        General: No swelling. Normal range of motion.     Cervical back: Normal range of motion and neck supple.  Skin:    General: Skin is warm and dry.     Coloration: Skin is not jaundiced.  Neurological:     General: No focal deficit present.     Mental Status: She is alert and oriented to person, place, and time.  Psychiatric:        Mood and Affect: Mood normal.        Behavior: Behavior normal.      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  Will order today.  Will order MELD labs as well as AFP to [facility].  Repeat EGD 2025 for variceal surveillance.  No issues with hypervolemia or hepatic encephalopathy.  Continue to monitor.  In regards to her lymphocytic colitis, has trialed and failed budesonide and Pepto-Bismol.  Recommended she start being more aggressive with her Imodium taking 3 times a day regardless of her symptoms and see how she does.  Can take dicyclomine on top of this as needed.  Follow-up in 3 months.  If not improved, can consider immune modulator such as azathioprine.  Will need TPMT checked prior to considering this.  Would start on 50 mg daily.  Will need CBC and LFTs weekly x 4 weeks and then every 2 weeks for an additional 8 weeks and every 3-6 months thereafter.    09/16/2022 9:44 AM   Disclaimer: This note was dictated with voice recognition software. Similar sounding words can inadvertently be transcribed and may not be corrected upon review.

## 2022-09-16 NOTE — Patient Instructions (Signed)
I will order ultrasound of your liver today for liver cancer screening.  I am going to order blood work at [facility] to check your liver labs as well as AFP which is a tumor marker for liver cancer.  I want you to start taking Imodium 3 times a day to see if this helps with your chronic diarrhea.  You can take dicyclomine on top of this.  Follow-up in 2 to 3 months.  If diarrhea does not improve, we can consider a an immune modulator such as azathioprine for your chronic lymphocytic colitis.  It was very nice seeing you again today.  Dr. Abbey Chatters

## 2022-09-17 LAB — PROTIME-INR
INR: 1 (ref 0.9–1.2)
Prothrombin Time: 11.3 s (ref 9.1–12.0)

## 2022-09-17 LAB — COMPREHENSIVE METABOLIC PANEL
ALT: 16 IU/L (ref 0–32)
AST: 25 IU/L (ref 0–40)
Albumin/Globulin Ratio: 1.4 (ref 1.2–2.2)
Albumin: 4.4 g/dL (ref 3.8–4.9)
Alkaline Phosphatase: 64 IU/L (ref 44–121)
BUN/Creatinine Ratio: 17 (ref 9–23)
BUN: 20 mg/dL (ref 6–24)
Bilirubin Total: 0.5 mg/dL (ref 0.0–1.2)
CO2: 18 mmol/L — ABNORMAL LOW (ref 20–29)
Calcium: 9.5 mg/dL (ref 8.7–10.2)
Chloride: 104 mmol/L (ref 96–106)
Creatinine, Ser: 1.16 mg/dL — ABNORMAL HIGH (ref 0.57–1.00)
Globulin, Total: 3.1 g/dL (ref 1.5–4.5)
Glucose: 130 mg/dL — ABNORMAL HIGH (ref 70–99)
Potassium: 4.5 mmol/L (ref 3.5–5.2)
Sodium: 137 mmol/L (ref 134–144)
Total Protein: 7.5 g/dL (ref 6.0–8.5)
eGFR: 56 mL/min/{1.73_m2} — ABNORMAL LOW (ref >59)

## 2022-09-17 LAB — AFP TUMOR MARKER: AFP, Serum, Tumor Marker: <1.8 ng/mL (ref 0.0–9.2)

## 2022-09-24 ENCOUNTER — Ambulatory Visit (HOSPITAL_COMMUNITY)
Admission: RE | Admit: 2022-09-24 | Discharge: 2022-09-24 | Disposition: A | Discharge status: Home / Self Care | Payer: BC Managed Care – PPO | Source: Ambulatory Visit | Attending: Family Medicine | Admitting: Family Medicine

## 2022-09-24 ENCOUNTER — Ambulatory Visit (HOSPITAL_COMMUNITY)
Admission: RE | Admit: 2022-09-24 | Discharge: 2022-09-24 | Disposition: A | Discharge status: Home / Self Care | Payer: BC Managed Care – PPO | Source: Ambulatory Visit | Attending: Internal Medicine | Admitting: Internal Medicine

## 2022-09-24 DIAGNOSIS — K7460 Unspecified cirrhosis of liver: Secondary | ICD-10-CM | POA: Diagnosis present

## 2022-09-24 DIAGNOSIS — Z1231 Encounter for screening mammogram for malignant neoplasm of breast: Secondary | ICD-10-CM | POA: Diagnosis present

## 2022-12-16 ENCOUNTER — Ambulatory Visit (INDEPENDENT_AMBULATORY_CARE_PROVIDER_SITE_OTHER): Payer: BC Managed Care – PPO | Admitting: Gastroenterology

## 2022-12-16 ENCOUNTER — Encounter: Payer: Self-pay | Admitting: Gastroenterology

## 2022-12-16 VITALS — BP 104/69 | HR 64 | Temp 98.7°F | Ht 63.0 in | Wt 196.8 lb

## 2022-12-16 DIAGNOSIS — K7460 Unspecified cirrhosis of liver: Secondary | ICD-10-CM

## 2022-12-16 NOTE — Progress Notes (Signed)
Gastroenterology Office Note     Primary Care Physician:  Benita StabileHall, John Z, MD  Primary Gastroenterologist: Dr. Marletta Lorarver    Chief Complaint   Chief Complaint  Patient presents with   Cirrhosis    Follow up on cirrhosis. Has some concerns about swelling. Has taken some left over fluid pills she had on hand. Takes about twice a week. ( Torsemide and she thinks it is 10mg )      History of Present Illness   Asencion IslamSamantha P Shaver is a 57 y.o. 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 Surveillance due in 2025.    She has desired yearly US. Hep A/B vaccinations completed. US on file from Jan 2024 without hepatoma. AFP normal in Jan 2024. MELD 8 in Jan 2024.   188 in January. Noted some weight gain. Today 196. Walking more. Stomach felt bloated yesterday and tight. Noted intermittently. Took torsemide 10 mg that she had left over. States 5 lbs lost. Trying to read labels. No extra salt. Doesn't feel bad. No lower extremity edema.   Diarrhea has improved significantly on Imodium. Taking Imodium now just once a day instead of TID. Took TID for 2 months and noted improvement. Took Torsemide 2 days ago. Averaging about once to twice a  week if sees a big change on scale.      Past Medical History:  Diagnosis Date   Family history of adverse reaction to anesthesia    mother-- PONV   Frequency of urination    History of kidney stones    Hypertension    Hypothyroidism    Lupus    effects joints--  rheumologist-  dr Dier[facility]re [facility]   Right ureteral stone    Type 2 diabetes mellitus    Urgency of urination    Wears glasses     Past Surgical History:  Procedure Laterality Date   BIOPSY  02/17/2018   Procedure: BIOPSY;  Surgeon: West BaliFields, Sandi L, MD;  [facility];  Service: Endoscopy;;  duodenum;    BIOPSY  04/21/2021   Procedure: BIOPSY;  Surgeon: Lanelle Balarver, Charles K, DO;  [facility];  Service: Endoscopy;;  gastric colon   CHOLECYSTECTOMY OPEN  age 89's   COLONOSCOPY N/A 04/10/2018   recto-sigmoid diverticulosis, torturous left colon, external/internal hemorrhoids   COLONOSCOPY WITH PROPOFOL N/A 04/21/2021   non-bleeding internal hemorrhoid, sigmoid and descending colon diverticulosis, random colonic biopsies consistent with chronic lymphocytic colitis.   CYSTOSCOPY WITH RETROGRADE PYELOGRAM, URETEROSCOPY AND STENT PLACEMENT Right 04/04/2015   Procedure: CYSTOSCOPY WITH RETROGRADE PYELOGRAM, URETEROSCOPY AND STENT PLACEMENT;  Surgeon: Sebastian Acheheodore Manny, MD;  [facility];  Service: Urology;  Laterality: Right;   ESOPHAGOGASTRODUODENOSCOPY N/A 08/25/2015   Grade 1 varices, patent Schatzki's ring, moderate erosive gastritis.   ESOPHAGOGASTRODUODENOSCOPY N/A 02/17/2018   non-obstructing Schatzki's ring, non-bleeding diverticulum in second portion of duodenum, erosive gastritis   ESOPHAGOGASTRODUODENOSCOPY (EGD) WITH PROPOFOL N/A 04/21/2021   Grade 1 varices, portal gastropathy, gastritis. Surveillance in 3 years. Negative H.pylori.   HOLMIUM LASER APPLICATION Right 04/04/2015   Procedure: HOLMIUM LASER APPLICATION;  Surgeon: Sebastian Acheheodore Manny, MD;  [facility];  Service: Urology;  Laterality: Right;    Current Outpatient Medications  Medication Sig Dispense Refill   acetaminophen (TYLENOL) 500 MG tablet Take 1,000 mg by mouth every 6 (six) hours  as needed for moderate pain.     ALPRAZolam (XANAX) 0.25 MG tablet Take 0.25 mg by mouth daily as needed for anxiety.     atorvastatin (LIPITOR) 10 MG tablet Take 10 mg by mouth daily.      augmented betamethasone dipropionate (DIPROLENE-AF) 0.05 % cream Apply 1 application topically See admin instructions. Apply once daily, may apply up to 4 times a day as needed for psoriasis      Cholecalciferol (VITAMIN D) 50 MCG (2000 UT) tablet Take 2,000 Units by mouth daily.     diphenhydrAMINE (BENADRYL) 25 MG tablet Take 25 mg by mouth daily as needed for allergies.     fenofibrate 160 MG tablet Take 160 mg by mouth at bedtime.     HYDROcodone-acetaminophen (NORCO/VICODIN) 5-325 MG tablet Take 1 tablet by mouth every 6 (six) hours as needed for moderate pain. For lupus     hydroxychloroquine (PLAQUENIL) 200 MG tablet Take 200 mg by mouth 2 (two) times daily.      Krill Oil 500 MG CAPS Take 500 mg by mouth in the morning and at bedtime.     levothyroxine (SYNTHROID, LEVOTHROID) 25 MCG tablet Take 25 mcg by mouth daily before breakfast.     lisinopril (PRINIVIL,ZESTRIL) 5 MG tablet Take 5 mg by mouth every morning.     magnesium oxide (MAG-OX) 400 MG tablet Take 400 mg by mouth daily.     metFORMIN (GLUCOPHAGE-XR) 500 MG 24 hr tablet Take 1,000 mg by mouth 2 (two) times daily.     metoprolol succinate (TOPROL-XL) 50 MG 24 hr tablet Take 50 mg by mouth every morning. Take with or immediately following a meal.     Milk Thistle 1000 MG CAPS Take 1,000 mg by mouth daily.      Polyethyl Glycol-Propyl Glycol (SYSTANE OP) Place 1 drop into both eyes daily as needed (dry eyes).     Probiotic Product (PROBIOTIC PO) Take 1 capsule by mouth daily.     tiZANidine (ZANAFLEX) 4 MG tablet Take 4 mg by mouth at bedtime.     TREMFYA 100 MG/ML SOPN Inject 1 Dose into the skin every 8 (eight) weeks.     No current facility-administered medications for this visit.    Allergies as of 12/16/2022 - Review Complete 12/16/2022  Allergen Reaction Noted   Prednisone Hypertension 03/11/2022   Clindamycin/lincomycin  12/16/2022   Penicillins Hives 10/26/2010   Doxycycline Rash 07/18/2017   Sulfamethoxazole-trimethoprim Other (See Comments) and Rash 09/18/2019    Family History  Problem Relation Age of Onset   Liver disease Mother    Diabetes type II Mother    Hypertension Mother    Diabetes  Mellitus II Father    Hypertension Father    Heart disease Father    Colon cancer Neg Hx     Social History   Socioeconomic History   Marital status: Married    Spouse name: Not on file   Number of children: Not on file   Years of education: Not on file   Highest education level: Not on file  Occupational History   Not on file  Tobacco Use   Smoking status: Every Day    Packs/day: 1.00    Years: 0.00    Additional pack years: 0.00    Total pack years: 0.00    Types: Cigarettes    Passive exposure: Current   Smokeless tobacco: Never   Tobacco comments:    one pack daily  Vaping Use   Vaping Use:  Never used  Substance and Sexual Activity   Alcohol use: Yes    Alcohol/week: 0.0 standard drinks of alcohol    Comment: Very rarely: typically once every 6+ months; typically 1 drink per occurrence.   Drug use: No   Sexual activity: Not on file  Other Topics Concern   Not on file  Social History Narrative   Not on file   Social Determinants of Health   Financial Resource Strain: Not on file  Food Insecurity: Not on file  Transportation Needs: Not on file  Physical Activity: Not on file  Stress: Not on file  Social Connections: Not on file  Intimate Partner Violence: Not on file     Review of Systems   Gen: Denies any fever, chills, fatigue, weight loss, lack of appetite.  CV: Denies chest pain, heart palpitations, peripheral edema, syncope.  Resp: Denies shortness of breath at rest or with exertion. Denies wheezing or cough.  GI: Denies dysphagia or odynophagia. Denies jaundice, hematemesis, fecal incontinence. GU : Denies urinary burning, urinary frequency, urinary hesitancy MS: Denies joint pain, muscle weakness, cramps, or limitation of movement.  Derm: Denies rash, itching, dry skin Psych: Denies depression, anxiety, memory loss, and confusion Heme: Denies bruising, bleeding, and enlarged lymph nodes.   Physical Exam   BP 104/69   Pulse 64   Temp 98.7  F (37.1 C) (Oral)   Ht 5\' 3"  (1.6 m)   Wt 196 lb 12.8 oz (89.3 kg)   BMI 34.86 kg/m  General:   Alert and oriented. Pleasant and cooperative. Well-nourished and well-developed.  Head:  Normocephalic and atraumatic. Eyes:  Without icterus Abdomen:  +BS, soft, non-tender and non-distended. No HSM noted. No guarding or rebound. No masses appreciated.  Rectal:  Deferred  Msk:  Symmetrical without gross deformities. Normal posture. Extremities:  Without edema. Neurologic:  Alert and  oriented x4;  grossly normal neurologically. Skin:  Intact without significant lesions or rashes. Psych:  Alert and cooperative. Normal mood and affect.   Assessment   Chelsea Estrada is a 57 y.o. 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 Surveillance due in 2025.   Cirrhosis: she has noted fluctuations in weight but no lower extremity edema and no anasarca. I do not appreciate any concerning findings today. I have asked her to message me if she notes any further fluctuations; she had been taking torsemide 10 mg on her own about once to twice a week. We will focus on low sodium diet, and she will message if needed. [place] in Jan 2025, as she has requested only yearly imaging. Next labs on recall for 6 months. Remains compensated.   Diarrhea: history of microscopic colitis. Failed budesonide. Much improved on Imodium and taking just once daily. Overall improved.      PLAN    2 gram sodium diet Call if any further fluctuations Labs on recall for 6 months EGD in 2025 6 month follow-up US in Jan 2025 (per her request yearly instead of every 6 months)   Gelene Mink, PhD, ANP-BC [facility] Gastroenter[facility]

## 2022-12-16 NOTE — Patient Instructions (Signed)
Please message me if you need to take any more fluid pills.  Let's focus on the strict low sodium diet, no more than 2 grams a day.  We will see you in 6 months!  I enjoyed seeing you again today! At our first visit, I mentioned how I value our relationship and want to provide genuine, compassionate, and quality care. You may receive a survey regarding your visit with me, and I welcome your feedback! Thanks so much for taking the time to complete this. I look forward to seeing you again.   Gelene Mink, PhD, ANP-BC [facility] Gastroenterology

## 2023-02-09 ENCOUNTER — Encounter: Payer: Self-pay | Admitting: Internal Medicine

## 2023-03-14 ENCOUNTER — Other Ambulatory Visit (HOSPITAL_COMMUNITY): Payer: Self-pay

## 2023-03-14 MED ORDER — OZEMPIC (0.25 OR 0.5 MG/DOSE) 2 MG/3ML ~~LOC~~ SOPN
0.2500 mg | PEN_INJECTOR | SUBCUTANEOUS | 2 refills | Status: DC
  Filled 2023-03-14: qty 3, 42d supply, fill #0
  Filled 2023-04-19: qty 3, 28d supply, fill #1

## 2023-04-25 ENCOUNTER — Other Ambulatory Visit (HOSPITAL_COMMUNITY): Payer: Self-pay

## 2023-06-21 ENCOUNTER — Encounter: Payer: Self-pay | Admitting: Gastroenterology

## 2023-06-21 ENCOUNTER — Ambulatory Visit: Payer: BC Managed Care – PPO | Admitting: Gastroenterology

## 2023-06-21 VITALS — BP 95/62 | HR 77 | Temp 98.7°F | Ht 63.0 in | Wt 204.4 lb

## 2023-06-21 DIAGNOSIS — K52832 Lymphocytic colitis: Secondary | ICD-10-CM | POA: Diagnosis not present

## 2023-06-21 DIAGNOSIS — K7460 Unspecified cirrhosis of liver: Secondary | ICD-10-CM | POA: Diagnosis not present

## 2023-06-21 NOTE — Patient Instructions (Signed)
Please complete labs at [facility].  I have ordered an ultrasound for routine purposes.  Continue the low salt diet, no more than 2 grams a day!  We will see you in 6 months!  I enjoyed seeing you again today! I value our relationship and want to provide genuine, compassionate, and quality care. You may receive a survey regarding your visit with me, and I welcome your feedback! Thanks so much for taking the time to complete this. I look forward to seeing you again.      Gelene Mink, PhD, ANP-BC [facility] Gastroenter[facility]

## 2023-06-21 NOTE — Progress Notes (Signed)
Gastroenterology Office Note     Primary Care Physician:  [name], MD  Primary Gastroenterologist: Dr. Marletta Lor   Chief Complaint   Chief Complaint  Patient presents with   Follow-up    Follow up on Hepatic cirrhosis     History of Present Illness   Chelsea Estrada is a 57 y.o. female presenting today with a history of cirrhosis due to [facility], diarrhea, colonoscopy Aug 2022 with chronic lymphocytic colitis. 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 Surveillance due in 2025.    She has desired yearly [place]. Hep A/B vaccinations completed. [place] on file from Jan 2024 without hepatoma. AFP normal in Jan 2024. Due for labs now.   Alternates between constipation and diarrhea. Ozempic has helped decrearse diarrhea. States she is gaining weight on Ozempic. Feels tighter in lower abdomen. She is taking furosemide 20 mg every other day at most and tries to just take prn. Prescribed by PCP. Took an extra torsemide on her own last night. She will feel some lower extremity swelling at times and sometimes feels tight in lower abdomen. No added salt. Low sodium diet. No overt GI bleeding. No mental status changes or confusion. Denies jaundice or pruritus.   Colonoscopy Aug 2022: internal hemorrhoids, diverticulosis, lymphocytic colitis.  EGD Aug 2022: Grade 1 varices, portal gastropathy, gastritis, surveillance 3 years, negative H.pylori.   Past Medical History:  Diagnosis Date   Family history of adverse reaction to anesthesia    mother-- PONV   Frequency of urination    History of kidney stones    Hypertension    Hypothyroidism    Lupus    effects joints--  rheumologist-  dr Dierdre Forth w/ [facility]   Right ureteral stone    Type 2 diabetes mellitus (HCC)    Urgency of urination    Wears glasses     Past Surgical History:  Procedure Laterality Date   BIOPSY  02/17/2018   Procedure: BIOPSY;  Surgeon: West Bali, MD;  [facility];  Service:  Endoscopy;;  duodenum;   BIOPSY  04/21/2021   Procedure: BIOPSY;  Surgeon: Lanelle Bal, DO;  [facility];  Service: Endoscopy;;  gastric colon   CHOLECYSTECTOMY OPEN  age 62's   COLONOSCOPY N/A 04/10/2018   recto-sigmoid diverticulosis, torturous left colon, external/internal hemorrhoids   COLONOSCOPY WITH PROPOFOL N/A 04/21/2021   non-bleeding internal hemorrhoid, sigmoid and descending colon diverticulosis, random colonic biopsies consistent with chronic lymphocytic colitis.   CYSTOSCOPY WITH RETROGRADE PYELOGRAM, URETEROSCOPY AND STENT PLACEMENT Right 04/04/2015   Procedure: CYSTOSCOPY WITH RETROGRADE PYELOGRAM, URETEROSCOPY AND STENT PLACEMENT;  Surgeon: Sebastian Ache, MD;  Location: [facility];  Service: Urology;  Laterality: Right;   ESOPHAGOGASTRODUODENOSCOPY N/A 08/25/2015   Grade 1 varices, patent Schatzki's ring, moderate erosive gastritis.   ESOPHAGOGASTRODUODENOSCOPY N/A 02/17/2018   non-obstructing Schatzki's ring, non-bleeding diverticulum in second portion of duodenum, erosive gastritis   ESOPHAGOGASTRODUODENOSCOPY (EGD) WITH PROPOFOL N/A 04/21/2021   Grade 1 varices, portal gastropathy, gastritis. Surveillance in 3 years. Negative H.pylori.   HOLMIUM LASER APPLICATION Right 04/04/2015   Procedure: HOLMIUM LASER APPLICATION;  Surgeon: Sebastian Ache, MD;  Location[facility];  Service: Urology;  Laterality: Right;    Current Outpatient Medications  Medication Sig Dispense Refill   acetaminophen (TYLENOL) 500 MG tablet Take 1,000 mg by mouth every 6 (six) hours as needed for moderate pain.     ALPRAZolam (XANAX) 0.25 MG tablet Take 0.5  mg by mouth daily as needed for anxiety.     atorvastatin (LIPITOR) 10 MG tablet Take 10 mg by mouth daily.      augmented betamethasone dipropionate (DIPROLENE-AF) 0.05 % cream Apply 1 application topically See admin instructions. Apply once daily, may apply up to 4 times a day as needed for  psoriasis     Cholecalciferol (VITAMIN D) 50 MCG (2000 UT) tablet Take 2,000 Units by mouth daily.     diphenhydrAMINE (BENADRYL) 25 MG tablet Take 25 mg by mouth daily as needed for allergies.     fenofibrate 160 MG tablet Take 160 mg by mouth at bedtime.     furosemide (LASIX) 20 MG tablet Take 20 mg by mouth every other day.     HYDROcodone-acetaminophen (NORCO/VICODIN) 5-325 MG tablet Take 1 tablet by mouth every 6 (six) hours as needed for moderate pain. For lupus     hydroxychloroquine (PLAQUENIL) 200 MG tablet Take 200 mg by mouth 2 (two) times daily.      Krill Oil 500 MG CAPS Take 500 mg by mouth in the morning and at bedtime.     levothyroxine (SYNTHROID, LEVOTHROID) 25 MCG tablet Take 25 mcg by mouth daily before breakfast.     lisinopril (PRINIVIL,ZESTRIL) 5 MG tablet Take 5 mg by mouth every morning.     magnesium oxide (MAG-OX) 400 MG tablet Take 400 mg by mouth daily.     metoprolol succinate (TOPROL-XL) 50 MG 24 hr tablet Take 50 mg by mouth every morning. Take with or immediately following a meal.     Milk Thistle 1000 MG CAPS Take 1,000 mg by mouth daily.      Polyethyl Glycol-Propyl Glycol (SYSTANE OP) Place 1 drop into both eyes daily as needed (dry eyes).     Probiotic Product (PROBIOTIC PO) Take 1 capsule by mouth daily.     Semaglutide,0.25 or 0.5MG /DOS, (OZEMPIC, 0.25 OR 0.5 MG/DOSE,) 2 MG/3ML SOPN Inject 0.25 mg into the skin once a week for 4 weeks then increase to 0.5mg  weekly (Patient taking differently: Inject 1 mg into the skin once a week.) 6 mL 2   tiZANidine (ZANAFLEX) 4 MG tablet Take 4 mg by mouth at bedtime.     TREMFYA 100 MG/ML SOPN Inject 1 Dose into the skin every 8 (eight) weeks.     No current facility-administered medications for this visit.    Allergies as of 06/21/2023 - Review Complete 06/21/2023  Allergen Reaction Noted   Prednisone Hypertension 03/11/2022   Clindamycin/lincomycin  12/16/2022   Penicillins Hives 10/26/2010   Doxycycline Rash  07/18/2017   Sulfamethoxazole-trimethoprim Other (See Comments) and Rash 09/18/2019    Family History  Problem Relation Age of Onset   Liver disease Mother    Diabetes type II Mother    Hypertension Mother    Diabetes Mellitus II Father    Hypertension Father    Heart disease Father    Colon cancer Neg Hx     Social History   Socioeconomic History   Marital status: Married    Spouse name: Not on file   Number of children: Not on file   Years of education: Not on file   Highest education level: Not on file  Occupational History   Not on file  Tobacco Use   Smoking status: Every Day    Current packs/day: 1.00    Types: Cigarettes    Passive exposure: Current   Smokeless tobacco: Never   Tobacco comments:    one pack  daily  Vaping Use   Vaping status: Never Used  Substance and Sexual Activity   Alcohol use: Yes    Alcohol/week: 0.0 standard drinks of alcohol    Comment: Very rarely: typically once every 6+ months; typically 1 drink per occurrence.   Drug use: No   Sexual activity: Not on file  Other Topics Concern   Not on file  Social History Narrative   Not on file   Social Determinants of Health   Financial Resource Strain: Not on file  Food Insecurity: Not on file  Transportation Needs: Not on file  Physical Activity: Not on file  Stress: Not on file  Social Connections: Not on file  Intimate Partner Violence: Not on file     Review of Systems   Gen: Denies any fever, chills, fatigue, weight loss, lack of appetite.  CV: Denies chest pain, heart palpitations, peripheral edema, syncope.  Resp: Denies shortness of breath at rest or with exertion. Denies wheezing or cough.  GI: Denies dysphagia or odynophagia. Denies jaundice, hematemesis, fecal incontinence. GU : Denies urinary burning, urinary frequency, urinary hesitancy MS: Denies joint pain, muscle weakness, cramps, or limitation of movement.  Derm: Denies rash, itching, dry skin Psych: Denies  depression, anxiety, memory loss, and confusion Heme: Denies bruising, bleeding, and enlarged lymph nodes.   Physical Exam   BP 95/62   Pulse 77   Temp 98.7 F (37.1 C)   Ht 5\' 3"  (1.6 m)   Wt 204 lb 6.4 oz (92.7 kg)   BMI 36.21 kg/m  General:   Alert and oriented. Pleasant and cooperative. Well-nourished and well-developed.  Head:  Normocephalic and atraumatic. Eyes:  Without icterus Abdomen:  +BS, soft, non-tender and non-distended. No HSM noted. No guarding or rebound. No masses appreciated.  Rectal:  Deferred  Msk:  Symmetrical without gross deformities. Normal posture. Extremities:  Without edema. Neurologic:  Alert and  oriented x4;  grossly normal neurologically. Skin:  Intact without significant lesions or rashes. Psych:  Alert and cooperative. Normal mood and affect.   Assessment   Chelsea Estrada is a 57 y.o. 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  Cirrhosis: remains overall well-compensated. PCP has prescribed low dose furosemide to take just prn. No significant edema or any ascites on exam today. Will update labs and [place] now. EGD due in Aug 2025. She has preferred yearly ultrasounds instead of every 6 months.   Lymphocytic colitis: improved, quiescent at this time. At baseline for bowel habits.   PLAN    Labs, US abdomen complete EGD in Aug 2025 Low salt diet 6 month follow-up   Gelene Mink, PhD, [facility] Gastroenterology

## 2023-06-23 DIAGNOSIS — R899 Unspecified abnormal finding in specimens from other organs, systems and tissues: Secondary | ICD-10-CM

## 2023-06-23 DIAGNOSIS — K7460 Unspecified cirrhosis of liver: Secondary | ICD-10-CM

## 2023-06-23 LAB — CBC WITH DIFFERENTIAL/PLATELET
Basophils Absolute: 0 10*3/uL (ref 0.0–0.2)
Basos: 1 %
EOS (ABSOLUTE): 0.1 10*3/uL (ref 0.0–0.4)
Eos: 2 %
Hematocrit: 35.1 % (ref 34.0–46.6)
Hemoglobin: 11.6 g/dL (ref 11.1–15.9)
Immature Grans (Abs): 0 10*3/uL (ref 0.0–0.1)
Immature Granulocytes: 0 %
Lymphocytes Absolute: 1 10*3/uL (ref 0.7–3.1)
Lymphs: 30 %
MCH: 33.3 pg — ABNORMAL HIGH (ref 26.6–33.0)
MCHC: 33 g/dL (ref 31.5–35.7)
MCV: 101 fL — ABNORMAL HIGH (ref 79–97)
Monocytes Absolute: 0.4 10*3/uL (ref 0.1–0.9)
Monocytes: 12 %
Neutrophils Absolute: 1.7 10*3/uL (ref 1.4–7.0)
Neutrophils: 55 %
Platelets: 63 10*3/uL — CL (ref 150–450)
RBC: 3.48 x10E6/uL — ABNORMAL LOW (ref 3.77–5.28)
RDW: 12.5 % (ref 11.7–15.4)
WBC: 3.2 10*3/uL — ABNORMAL LOW (ref 3.4–10.8)

## 2023-06-23 LAB — COMPREHENSIVE METABOLIC PANEL
ALT: 19 [IU]/L (ref 0–32)
AST: 30 [IU]/L (ref 0–40)
Albumin: 4.2 g/dL (ref 3.8–4.9)
Alkaline Phosphatase: 64 [IU]/L (ref 44–121)
BUN/Creatinine Ratio: 16 (ref 9–23)
BUN: 31 mg/dL — ABNORMAL HIGH (ref 6–24)
Bilirubin Total: 0.5 mg/dL (ref 0.0–1.2)
CO2: 22 mmol/L (ref 20–29)
Calcium: 10.4 mg/dL — ABNORMAL HIGH (ref 8.7–10.2)
Chloride: 105 mmol/L (ref 96–106)
Creatinine, Ser: 1.99 mg/dL — ABNORMAL HIGH (ref 0.57–1.00)
Globulin, Total: 3 g/dL (ref 1.5–4.5)
Glucose: 158 mg/dL — ABNORMAL HIGH (ref 70–99)
Potassium: 4.3 mmol/L (ref 3.5–5.2)
Sodium: 143 mmol/L (ref 134–144)
Total Protein: 7.2 g/dL (ref 6.0–8.5)
eGFR: 29 mL/min/{1.73_m2} — ABNORMAL LOW (ref >59)

## 2023-06-23 LAB — PROTIME-INR
INR: 1.1 (ref 0.9–1.2)
Prothrombin Time: 11.9 s (ref 9.1–12.0)

## 2023-06-23 LAB — AFP TUMOR MARKER: AFP, Serum, Tumor Marker: 2.2 ng/mL (ref 0.0–9.2)

## 2023-06-27 NOTE — Telephone Encounter (Signed)
Please refer to Nephrology due to worsening creatinine, GFR 29, in setting of cirrhosis. Creatinine 1.99, GFR 29. The only labs I have prior to this is from 9 months ago with creatinine 1.16, GFR 56.

## 2023-06-27 NOTE — Addendum Note (Signed)
Addended by: Armstead Peaks on: 06/27/2023 03:24 PM   Modules accepted: Orders

## 2023-06-28 ENCOUNTER — Encounter: Payer: Self-pay | Admitting: Nurse Practitioner

## 2023-06-29 ENCOUNTER — Ambulatory Visit (HOSPITAL_COMMUNITY)
Admission: RE | Admit: 2023-06-29 | Discharge: 2023-06-29 | Disposition: A | Discharge status: Home / Self Care | Payer: BC Managed Care – PPO | Source: Ambulatory Visit | Attending: Gastroenterology | Admitting: Gastroenterology

## 2023-06-29 DIAGNOSIS — K7460 Unspecified cirrhosis of liver: Secondary | ICD-10-CM | POA: Insufficient documentation

## 2023-07-06 ENCOUNTER — Inpatient Hospital Stay: Payer: BC Managed Care – PPO

## 2023-07-06 ENCOUNTER — Inpatient Hospital Stay: Payer: BC Managed Care – PPO | Attending: Oncology | Admitting: Oncology

## 2023-07-06 ENCOUNTER — Telehealth: Payer: Self-pay | Admitting: *Deleted

## 2023-07-06 ENCOUNTER — Encounter: Payer: Self-pay | Admitting: Oncology

## 2023-07-06 VITALS — BP 112/74 | HR 74 | Temp 98.1°F | Resp 17 | Ht 63.0 in | Wt 205.4 lb

## 2023-07-06 DIAGNOSIS — M329 Systemic lupus erythematosus, unspecified: Secondary | ICD-10-CM

## 2023-07-06 DIAGNOSIS — D696 Thrombocytopenia, unspecified: Secondary | ICD-10-CM | POA: Diagnosis present

## 2023-07-06 DIAGNOSIS — K7460 Unspecified cirrhosis of liver: Secondary | ICD-10-CM

## 2023-07-06 DIAGNOSIS — E538 Deficiency of other specified B group vitamins: Secondary | ICD-10-CM

## 2023-07-06 DIAGNOSIS — R161 Splenomegaly, not elsewhere classified: Secondary | ICD-10-CM | POA: Diagnosis not present

## 2023-07-06 DIAGNOSIS — Z87891 Personal history of nicotine dependence: Secondary | ICD-10-CM | POA: Diagnosis not present

## 2023-07-06 LAB — CBC WITH DIFFERENTIAL/PLATELET
Abs Immature Granulocytes: 0.02 10*3/uL (ref 0.00–0.07)
Basophils Absolute: 0 10*3/uL (ref 0.0–0.1)
Basophils Relative: 1 %
Eosinophils Absolute: 0.1 10*3/uL (ref 0.0–0.5)
Eosinophils Relative: 2 %
HCT: 36.1 % (ref 36.0–46.0)
Hemoglobin: 11.4 g/dL — ABNORMAL LOW (ref 12.0–15.0)
Immature Granulocytes: 1 %
Lymphocytes Relative: 30 %
Lymphs Abs: 1 10*3/uL (ref 0.7–4.0)
MCH: 32.1 pg (ref 26.0–34.0)
MCHC: 31.6 g/dL (ref 30.0–36.0)
MCV: 101.7 fL — ABNORMAL HIGH (ref 80.0–100.0)
Monocytes Absolute: 0.3 10*3/uL (ref 0.1–1.0)
Monocytes Relative: 8 %
Neutro Abs: 2 10*3/uL (ref 1.7–7.7)
Neutrophils Relative %: 58 %
Platelets: 78 10*3/uL — ABNORMAL LOW (ref 150–400)
RBC: 3.55 MIL/uL — ABNORMAL LOW (ref 3.87–5.11)
RDW: 13 % (ref 11.5–15.5)
WBC: 3.4 10*3/uL — ABNORMAL LOW (ref 4.0–10.5)
nRBC: 0 % (ref 0.0–0.2)

## 2023-07-06 LAB — HEPATITIS PANEL, ACUTE
HCV Ab: NONREACTIVE
Hep A IgM: NONREACTIVE
Hep B C IgM: NONREACTIVE
Hepatitis B Surface Ag: NONREACTIVE

## 2023-07-06 LAB — HIV ANTIBODY (ROUTINE TESTING W REFLEX): HIV Screen 4th Generation wRfx: NONREACTIVE

## 2023-07-06 LAB — VITAMIN B12: Vitamin B-12: 141 pg/mL — ABNORMAL LOW (ref 180–914)

## 2023-07-06 LAB — FERRITIN: Ferritin: 59 ng/mL (ref 11–307)

## 2023-07-06 LAB — FOLATE: Folate: 30.5 ng/mL (ref >5.9)

## 2023-07-06 MED ORDER — CYANOCOBALAMIN 1000 MCG PO CAPS: 1000.0000 mg | ORAL_CAPSULE | Freq: Every day | ORAL | 0 refills | Status: DC

## 2023-07-06 NOTE — Assessment & Plan Note (Addendum)
Chronic, possibly due to liver cirrhosis, splenomegaly, and lupus. Platelet clumping observed in previous blood smear, suggesting actual count may be higher than reported. No significant bleeding symptoms reported, except for easy bruising. -Repeat CBC today to reassess platelet count. -If platelets remain low, we will review blood smear under microscope. -Complete workup including hepatitis and HIV testing, and nutritional studies (including vitamin B12). -Patient does not any transfusion at this time.  RTC in 1 month for follow-up

## 2023-07-06 NOTE — Assessment & Plan Note (Signed)
Chronic, contributing to thrombocytopenia and splenomegaly. -No changes to current management.

## 2023-07-06 NOTE — Progress Notes (Addendum)
[facility] at [facility] HEMATOLOGY NEW VISIT  [name], MD  REASON FOR REFERRAL: Thrombocytopenia  SUMMARY OF HEMATOLOGIC HISTORY:  Latest Reference Range & Units 04/28/15 11:07 10/10/15 11:06 01/16/16 11:00 07/22/16 14:18 07/21/17 10:57 02/17/18 09:24 02/16/19 14:59 06/22/23 10:01  Platelets 150 - 450 x10E3/uL 166 CANCELED 145 (L) 155 121 (L) 90 (L) 118 (L) 63 (LL)  (LL): Data is critically low (L): Data is abnormally low   HISTORY OF PRESENT ILLNESS: Chelsea Estrada 57 y.o. female referred for thrombocytopenia.  She has a past medical history of SLE on Plaquenil, liver cirrhosis, lymphocytic colitis. She reports that her platelet counts have been decreasing over the past several years.The patient reports easy bruising, especially after administering Ozempic shots. She also mentions occasional bleeding from the gums and thin skin that tears easily. She experiences some fatigue, which she attributes to lupus.  She has no other complaints today.  On lab review, it is noticed that there was some clumping on the platelets when they were last checked.  Discussed with patient that we will recheck them today.  Patient quit smoking in January of this year.  Uses vape occasional alcohol.  No family history of blood disorders.   I have reviewed the past medical history, past surgical history, social history and family history with the patient   ALLERGIES:  is allergic to prednisone, clindamycin/lincomycin, penicillins, doxycycline, and sulfamethoxazole-trimethoprim.  MEDICATIONS:  Current Outpatient Medications  Medication Sig Dispense Refill   acetaminophen (TYLENOL) 500 MG tablet Take 1,000 mg by mouth every 6 (six) hours as needed for moderate pain.     ALPRAZolam (XANAX) 0.5 MG tablet      atorvastatin (LIPITOR) 10 MG tablet Take 10 mg by mouth daily.      augmented betamethasone dipropionate (DIPROLENE-AF) 0.05 % cream Apply 1 application topically See  admin instructions. Apply once daily, may apply up to 4 times a day as needed for psoriasis     Cholecalciferol (VITAMIN D) 50 MCG (2000 UT) tablet Take 2,000 Units by mouth daily.     Cyanocobalamin 1000 MCG CAPS Take 1,000 mg by mouth daily. 90 capsule 0   diphenhydrAMINE (BENADRYL) 25 MG tablet Take 25 mg by mouth daily as needed for allergies.     fenofibrate 160 MG tablet Take 160 mg by mouth at bedtime.     furosemide (LASIX) 20 MG tablet Take 20 mg by mouth every other day.     HYDROcodone-acetaminophen (NORCO/VICODIN) 5-325 MG tablet Take 1 tablet by mouth every 6 (six) hours as needed for moderate pain. For lupus     hydroxychloroquine (PLAQUENIL) 200 MG tablet Take 200 mg by mouth 2 (two) times daily.      Krill Oil 500 MG CAPS Take 500 mg by mouth in the morning and at bedtime.     Lancets (ONETOUCH DELICA PLUS LANCET33G) MISC Apply topically.     levothyroxine (SYNTHROID, LEVOTHROID) 25 MCG tablet Take 25 mcg by mouth daily before breakfast.     magnesium oxide (MAG-OX) 400 MG tablet Take 400 mg by mouth daily.     metoprolol succinate (TOPROL-XL) 50 MG 24 hr tablet Take 50 mg by mouth every morning. Take with or immediately following a meal.     Milk Thistle 1000 MG CAPS Take 1,000 mg by mouth daily.      ONETOUCH ULTRA TEST test strip      Polyethyl Glycol-Propyl Glycol (SYSTANE OP) Place 1 drop into both eyes daily as  needed (dry eyes).     Probiotic Product (PROBIOTIC PO) Take 1 capsule by mouth daily.     Semaglutide, 1 MG/DOSE, (OZEMPIC, 1 MG/DOSE,) 2 MG/1.5ML SOPN      tiZANidine (ZANAFLEX) 4 MG tablet Take 4 mg by mouth at bedtime.     TREMFYA 100 MG/ML SOPN Inject 1 Dose into the skin every 8 (eight) weeks.     No current facility-administered medications for this visit.     REVIEW OF SYSTEMS:   Constitutional: Denies fevers, chills or night sweats Eyes: Denies blurriness of vision Ears, nose, mouth, throat, and face: Denies mucositis or sore throat Respiratory:  Denies cough, dyspnea or wheezes Cardiovascular: Denies palpitation, chest discomfort or lower extremity swelling Gastrointestinal:  Denies nausea, heartburn or change in bowel habits Skin: Denies abnormal skin rashes Lymphatics: Denies new lymphadenopathy or easy bruising Neurological:Denies numbness, tingling or new weaknesses Behavioral/Psych: Mood is stable, no new changes  All other systems were reviewed with the patient and are negative.  PHYSICAL EXAMINATION:   Vitals:   07/06/23 0819  BP: 112/74  Pulse: 74  Resp: 17  Temp: 98.1 F (36.7 C)  SpO2: 97%    GENERAL:alert, no distress and comfortable SKIN: skin color, texture, turgor are normal, no rashes or significant lesions EYES: normal, Conjunctiva are pink and non-injected, sclera clear LUNGS: clear to auscultation and percussion with normal breathing effort HEART: regular rate & rhythm and no murmurs and no lower extremity edema ABDOMEN:abdomen soft, non-tender and normal bowel sounds, Bruise present on the left side of the abdomen, attributed to Ozempic injection. Musculoskeletal:no cyanosis of digits and no clubbing  NEURO: alert & oriented x 3 with fluent speech.  LABORATORY DATA:  I have reviewed the data as listed  Lab Results  Component Value Date   WBC 3.4 (L) 07/06/2023   NEUTROABS 2.0 07/06/2023   HGB 11.4 (L) 07/06/2023   HCT 36.1 07/06/2023   MCV 101.7 (H) 07/06/2023   PLT 78 (L) 07/06/2023      Component Value Date/Time   NA 143 06/22/2023 1001   K 4.3 06/22/2023 1001   CL 105 06/22/2023 1001   CO2 22 06/22/2023 1001   GLUCOSE 158 (H) 06/22/2023 1001   GLUCOSE 93 04/04/2015 1320   BUN 31 (H) 06/22/2023 1001   CREATININE 1.99 (H) 06/22/2023 1001   CALCIUM 10.4 (H) 06/22/2023 1001   PROT 7.2 06/22/2023 1001   ALBUMIN 4.2 06/22/2023 1001   AST 30 06/22/2023 1001   ALT 19 06/22/2023 1001   ALKPHOS 64 06/22/2023 1001   BILITOT 0.5 06/22/2023 1001   GFRNONAA 95 02/16/2019 1459   GFRAA 110  02/16/2019 1459       Chemistry      Component Value Date/Time   NA 143 06/22/2023 1001   K 4.3 06/22/2023 1001   CL 105 06/22/2023 1001   CO2 22 06/22/2023 1001   BUN 31 (H) 06/22/2023 1001   CREATININE 1.99 (H) 06/22/2023 1001      Component Value Date/Time   CALCIUM 10.4 (H) 06/22/2023 1001   ALKPHOS 64 06/22/2023 1001   AST 30 06/22/2023 1001   ALT 19 06/22/2023 1001   BILITOT 0.5 06/22/2023 1001       RADIOGRAPHIC STUDIES: US abdomen: 06/29/23: IMPRESSION: 1. Cirrhotic morphology of the liver. No focal lesion. 2. Splenomegaly.    ASSESSMENT & PLAN:  Patient is a 57 year old female presented for thrombocytopenia  Thrombocytopenia (HCC) Chronic, possibly due to liver cirrhosis, splenomegaly, and lupus. Platelet clumping observed  in previous blood smear, suggesting actual count may be higher than reported. No significant bleeding symptoms reported, except for easy bruising. -Repeat CBC today to reassess platelet count. -If platelets remain low, we will review blood smear under microscope. -Complete workup including hepatitis and HIV testing, and nutritional studies (including vitamin B12). -Patient does not any transfusion at this time.  RTC in 1 month for follow-up  Hepatic cirrhosis Chronic, contributing to thrombocytopenia and splenomegaly. -No changes to current management.  Splenomegaly Likely secondary to liver cirrhosis, contributing to thrombocytopenia.  Recent ultrasound showed a size of 17.5 cm -No changes to current management.   Vitamin B12 deficiency Labs today consistent with severe vitamin B12 deficiency. -Will schedule for vitamin B12 shots -Start oral vitamin B12 supplementation   Orders Placed This Encounter  Procedures   Hepatitis panel, acute    Standing Status:   Future    Number of Occurrences:   1    Standing Expiration Date:   07/05/2024   CBC with Differential/Platelet    Standing Status:   Future    Number of Occurrences:    1    Standing Expiration Date:   07/05/2024   Ferritin    Standing Status:   Future    Number of Occurrences:   1    Standing Expiration Date:   07/05/2024   Folate    Standing Status:   Future    Number of Occurrences:   1    Standing Expiration Date:   07/05/2024   Vitamin B12    Standing Status:   Future    Number of Occurrences:   1    Standing Expiration Date:   07/05/2024   Haptoglobin    Standing Status:   Future    Number of Occurrences:   1    Standing Expiration Date:   07/05/2024   HIV antibody (with reflex)    Standing Status:   Future    Number of Occurrences:   1    Standing Expiration Date:   07/05/2024   CBC with Differential/Platelet    Standing Status:   Future    Standing Expiration Date:   07/05/2024   Comprehensive metabolic panel    Standing Status:   Future    Standing Expiration Date:   07/05/2024    The total time spent in the appointment was 30 minutes encounter with patients including review of chart and various tests results, discussions about plan of care and coordination of care plan   All questions were answered. The patient knows to call the clinic with any problems, questions or concerns. No barriers to learning was detected.   Cindie Crumbly, MD 10/30/202410:37 AM

## 2023-07-06 NOTE — Addendum Note (Signed)
Addended byCindie Crumbly on: 07/06/2023 10:38 AM   Modules accepted: Orders

## 2023-07-06 NOTE — Patient Instructions (Signed)
VISIT SUMMARY:  During today's visit, we discussed your ongoing health concerns, including low platelet counts, liver cirrhosis, lupus, colitis, and chronic kidney disease. We reviewed your symptoms and current treatments, and we have outlined a plan to address each of these issues.  YOUR PLAN:  -THROMBOCYTOPENIA: Thrombocytopenia means having a low platelet count, which can lead to easy bruising and bleeding. We will repeat your complete blood count (CBC) today to reassess your platelet count. If the platelets remain low, we will review the blood smear under a microscope and complete a workup including tests for hepatitis, HIV, and nutritional studies like vitamin B12.  -LIVER CIRRHOSIS: Liver cirrhosis is a condition where the liver is scarred and permanently damaged. It can contribute to low platelet counts and an enlarged spleen. There are no changes to your current management plan for this condition.  -SPLENOMEGALY: Splenomegaly means having an enlarged spleen, which can be caused by liver cirrhosis and can contribute to low platelet counts. There are no changes to your current management plan for this condition.  -LUPUS: Lupus is an autoimmune disease that can affect various parts of the body, including the joints and skin. You are currently managing it with hydroxychloroquine (Plaquenil). Continue your current management plan with your rheumatologist.  -CHRONIC KIDNEY DISEASE: Chronic kidney disease means your kidneys are not functioning as well as they should. You were recently seen by a nephrologist who suspects that lisinopril may be a contributing factor. Continue your current management plan under the care of your nephrologist.  INSTRUCTIONS:  Please follow up in one month, or sooner if lab results are concerning.

## 2023-07-06 NOTE — Assessment & Plan Note (Signed)
Labs today consistent with severe vitamin B12 deficiency. -Will schedule for vitamin B12 shots -Start oral vitamin B12 supplementation

## 2023-07-06 NOTE — Assessment & Plan Note (Signed)
Likely secondary to liver cirrhosis, contributing to thrombocytopenia.  Recent ultrasound showed a size of 17.5 cm -No changes to current management.

## 2023-07-06 NOTE — Telephone Encounter (Signed)
Patient made aware of B 12 injection appointments and to start B12 1 mg daily.  Verbalized understanding.

## 2023-07-07 ENCOUNTER — Telehealth: Payer: Self-pay

## 2023-07-07 NOTE — Telephone Encounter (Signed)
Pt phoned and under the advise of her attorney to have a letter drawn up regarding "why she is unable to work" (disabled). She is trying to get her social security disability and the attorney states a letter is better than medical records. She stated that you told her you had the proof from her last colonoscopy regarding her having colitis. She has a hearing before a judge on November 25th. Pt asks if you would please do a letter.

## 2023-07-08 LAB — HAPTOGLOBIN: Haptoglobin: 54 mg/dL (ref 33–346)

## 2023-07-12 ENCOUNTER — Inpatient Hospital Stay: Payer: BC Managed Care – PPO | Attending: Oncology

## 2023-07-12 ENCOUNTER — Ambulatory Visit: Payer: BC Managed Care – PPO

## 2023-07-12 VITALS — BP 109/64 | HR 79 | Temp 96.9°F | Resp 18

## 2023-07-12 DIAGNOSIS — M329 Systemic lupus erythematosus, unspecified: Secondary | ICD-10-CM | POA: Insufficient documentation

## 2023-07-12 DIAGNOSIS — E538 Deficiency of other specified B group vitamins: Secondary | ICD-10-CM | POA: Diagnosis present

## 2023-07-12 DIAGNOSIS — D696 Thrombocytopenia, unspecified: Secondary | ICD-10-CM | POA: Insufficient documentation

## 2023-07-12 DIAGNOSIS — R161 Splenomegaly, not elsewhere classified: Secondary | ICD-10-CM | POA: Insufficient documentation

## 2023-07-12 DIAGNOSIS — K7460 Unspecified cirrhosis of liver: Secondary | ICD-10-CM | POA: Diagnosis not present

## 2023-07-12 DIAGNOSIS — Z87891 Personal history of nicotine dependence: Secondary | ICD-10-CM | POA: Insufficient documentation

## 2023-07-12 MED ORDER — CYANOCOBALAMIN 1000 MCG/ML IJ SOLN
1000.0000 ug | Freq: Once | INTRAMUSCULAR | Status: AC
  Administered 2023-07-12: 1000 ug via INTRAMUSCULAR
  Filled 2023-07-12: qty 1

## 2023-07-12 NOTE — Patient Instructions (Signed)
[facility] - A DEPT OF [facility]  Discharge Instructions: Thank you for choosing [facility] to provide your oncology and hematology care.  If you have a lab appointment with the Cancer Center - please note that after April 8th, 2024, all labs will be drawn in the cancer center.  You do not have to check in or register with the main entrance as you have in the past but will complete your check-in in the cancer center.  Wear comfortable clothing and clothing appropriate for easy access to any Portacath or PICC line.   We strive to give you quality time with your provider. You may need to reschedule your appointment if you arrive late (15 or more minutes).  Arriving late affects you and other patients whose appointments are after yours.  Also, if you miss three or more appointments without notifying the office, you may be dismissed from the clinic at the provider's discretion.      For prescription refill requests, have your pharmacy contact our office and allow 72 hours for refills to be completed.    Today you received the following injection: B12   To help prevent nausea and vomiting after your treatment, we encourage you to take your nausea medication as directed.  BELOW ARE SYMPTOMS THAT SHOULD BE REPORTED IMMEDIATELY: *FEVER GREATER THAN 100.4 F (38 C) OR HIGHER *CHILLS OR SWEATING *NAUSEA AND VOMITING THAT IS NOT CONTROLLED WITH YOUR NAUSEA MEDICATION *UNUSUAL SHORTNESS OF BREATH *UNUSUAL BRUISING OR BLEEDING *URINARY PROBLEMS (pain or burning when urinating, or frequent urination) *BOWEL PROBLEMS (unusual diarrhea, constipation, pain near the anus) TENDERNESS IN MOUTH AND THROAT WITH OR WITHOUT PRESENCE OF ULCERS (sore throat, sores in mouth, or a toothache) UNUSUAL RASH, SWELLING OR PAIN  UNUSUAL VAGINAL DISCHARGE OR ITCHING   Items with * indicate a potential emergency and should be followed up as soon as possible or go to the  Emergency Department if any problems should occur.  Please show the CHEMOTHERAPY ALERT CARD or IMMUNOTHERAPY ALERT CARD at check-in to the Emergency Department and triage nurse.  Should you have questions after your visit or need to cancel or reschedule your appointment, please contact [facility] - A DEPT OF [facility] [phone]  and follow the prompts.  Office hours are 8:00 a.m. to 4:30 p.m. Monday - Friday. Please note that voicemails left after 4:00 p.m. may not be returned until the following business day.  We are closed weekends and major holidays. You have access to a nurse at all times for urgent questions. Please call the main number to the clinic [phone] and follow the prompts.  For any non-urgent questions, you may also contact your provider using MyChart. We now offer e-Visits for anyone 32 and older to request care online for non-urgent symptoms. For details visit [email or web address].   Also download the MyChart app! Go to the app store, search "MyChart", open the app, select [facility], and log in with your MyChart username and password.

## 2023-07-12 NOTE — Progress Notes (Signed)
B12 injection  given per orders. Patient tolerated it well without problems. Vitals stable and discharged home from clinic ambulatory. Follow up as scheduled.

## 2023-07-18 NOTE — Telephone Encounter (Signed)
Pt dropped off a copy of a letter from her [employer] of what the pt's needs and she also wrote some things on the paper for you. On your desk in the yellow folder

## 2023-07-19 ENCOUNTER — Inpatient Hospital Stay: Payer: BC Managed Care – PPO

## 2023-07-19 VITALS — BP 119/57 | HR 85 | Temp 97.3°F | Resp 18

## 2023-07-19 DIAGNOSIS — E538 Deficiency of other specified B group vitamins: Secondary | ICD-10-CM | POA: Diagnosis not present

## 2023-07-19 MED ORDER — CYANOCOBALAMIN 1000 MCG/ML IJ SOLN
1000.0000 ug | Freq: Once | INTRAMUSCULAR | Status: AC
Start: 2023-07-19 — End: 2023-07-19
  Administered 2023-07-19: 1000 ug via INTRAMUSCULAR
  Filled 2023-07-19: qty 1

## 2023-07-19 NOTE — Progress Notes (Signed)
Patient tolerated injection with no complaints voiced.  Site clean and dry with no bruising or swelling noted at site.  See MAR for details.  Band aid applied.  Patient stable during and after injection.  Vss with discharge and left in satisfactory condition with no s/s of distress noted.  

## 2023-07-19 NOTE — Patient Instructions (Signed)

## 2023-07-26 ENCOUNTER — Inpatient Hospital Stay: Payer: BC Managed Care – PPO

## 2023-07-26 VITALS — BP 130/55 | HR 90 | Temp 97.4°F | Resp 17

## 2023-07-26 DIAGNOSIS — E538 Deficiency of other specified B group vitamins: Secondary | ICD-10-CM | POA: Diagnosis not present

## 2023-07-26 MED ORDER — CYANOCOBALAMIN 1000 MCG/ML IJ SOLN
1000.0000 ug | Freq: Once | INTRAMUSCULAR | Status: AC
  Administered 2023-07-26: 1000 ug via INTRAMUSCULAR
  Filled 2023-07-26: qty 1

## 2023-07-26 NOTE — Progress Notes (Signed)
Patient tolerated Vitamin B 12 injection with no complaints voiced.  Site clean and dry with no bruising or swelling noted.  No complaints of pain.  Discharged with vital signs stable and no signs or symptoms of distress noted.   ?

## 2023-07-26 NOTE — Patient Instructions (Signed)
 [facility] - A DEPT OF [facility]  Discharge Instructions: Thank you for choosing [facility] to provide your oncology and hematology care.  If you have a lab appointment with the Cancer Center - please note that after April 8th, 2024, all labs will be drawn in the cancer center.  You do not have to check in or register with the main entrance as you have in the past but will complete your check-in in the cancer center.  Wear comfortable clothing and clothing appropriate for easy access to any Portacath or PICC line.   We strive to give you quality time with your provider. You may need to reschedule your appointment if you arrive late (15 or more minutes).  Arriving late affects you and other patients whose appointments are after yours.  Also, if you miss three or more appointments without notifying the office, you may be dismissed from the clinic at the provider's discretion.      For prescription refill requests, have your pharmacy contact our office and allow 72 hours for refills to be completed.    Today you received the following:  Vitamin B12  Vitamin B12 Injection What is this medication? Vitamin B12 (VAHY tuh min B12) prevents and treats low vitamin B12 levels in your body. It is used in people who do not get enough vitamin B12 from their diet or when their digestive tract does not absorb enough. Vitamin B12 plays an important role in maintaining the health of your nervous system and red blood cells. This medicine may be used for other purposes; ask your health care provider or pharmacist if you have questions. COMMON BRAND NAME(S): B-12 Compliance Kit, B-12 Injection Kit, Cyomin, Dodex, LA-12, Nutri-Twelve, Physicians EZ Use B-12, Primabalt, Vitamin Deficiency Injectable System - B12 What should I tell my care team before I take this medication? They need to know if you have any of these conditions: Kidney disease Leber's  disease Megaloblastic anemia An unusual or allergic reaction to cyanocobalamin, cobalt, other medications, foods, dyes, or preservatives Pregnant or trying to get pregnant Breast-feeding How should I use this medication? This medication is injected into a muscle or deeply under the skin. It is usually given in a clinic or care team's office. However, your care team may teach you how to inject yourself. Follow all instructions. Talk to your care team about the use of this medication in children. Special care may be needed. Overdosage: If you think you have taken too much of this medicine contact a poison control center or emergency room at once. NOTE: This medicine is only for you. Do not share this medicine with others. What if I miss a dose? If you are given your dose at a clinic or care team's office, call to reschedule your appointment. If you give your own injections, and you miss a dose, take it as soon as you can. If it is almost time for your next dose, take only that dose. Do not take double or extra doses. What may interact with this medication? Alcohol Colchicine This list may not describe all possible interactions. Give your health care provider a list of all the medicines, herbs, non-prescription drugs, or dietary supplements you use. Also tell them if you smoke, drink alcohol, or use illegal drugs. Some items may interact with your medicine. What should I watch for while using this medication? Visit your care team regularly. You may need blood work done while you are taking  this medication. You may need to follow a special diet. Talk to your care team. Limit your alcohol intake and avoid smoking to get the best benefit. What side effects may I notice from receiving this medication? Side effects that you should report to your care team as soon as possible: Allergic reactions--skin rash, itching, hives, swelling of the face, lips, tongue, or throat Swelling of the ankles, hands, or  feet Trouble breathing Side effects that usually do not require medical attention (report to your care team if they continue or are bothersome): Diarrhea This list may not describe all possible side effects. Call your doctor for medical advice about side effects. You may report side effects to FDA at 1-800-FDA-1088. Where should I keep my medication? Keep out of the reach of children. Store at room temperature between 15 and 30 degrees C (59 and 85 degrees F). Protect from light. Throw away any unused medication after the expiration date. NOTE: This sheet is a summary. It may not cover all possible information. If you have questions about this medicine, talk to your doctor, pharmacist, or health care provider.  2024 Elsevier/Gold Standard (2021-05-05 00:00:00)     To help prevent nausea and vomiting after your treatment, we encourage you to take your nausea medication as directed.  BELOW ARE SYMPTOMS THAT SHOULD BE REPORTED IMMEDIATELY: *FEVER GREATER THAN 100.4 F (38 C) OR HIGHER *CHILLS OR SWEATING *NAUSEA AND VOMITING THAT IS NOT CONTROLLED WITH YOUR NAUSEA MEDICATION *UNUSUAL SHORTNESS OF BREATH *UNUSUAL BRUISING OR BLEEDING *URINARY PROBLEMS (pain or burning when urinating, or frequent urination) *BOWEL PROBLEMS (unusual diarrhea, constipation, pain near the anus) TENDERNESS IN MOUTH AND THROAT WITH OR WITHOUT PRESENCE OF ULCERS (sore throat, sores in mouth, or a toothache) UNUSUAL RASH, SWELLING OR PAIN  UNUSUAL VAGINAL DISCHARGE OR ITCHING   Items with * indicate a potential emergency and should be followed up as soon as possible or go to the Emergency Department if any problems should occur.  Please show the CHEMOTHERAPY ALERT CARD or IMMUNOTHERAPY ALERT CARD at check-in to the Emergency Department and triage nurse.  Should you have questions after your visit or need to cancel or reschedule your appointment, please contact [facility] - A DEPT OF [facility] [phone]  and follow the prompts.  Office hours are 8:00 a.m. to 4:30 p.m. Monday - Friday. Please note that voicemails left after 4:00 p.m. may not be returned until the following business day.  We are closed weekends and major holidays. You have access to a nurse at all times for urgent questions. Please call the main number to the clinic [phone] and follow the prompts.  For any non-urgent questions, you may also contact your provider using MyChart. We now offer e-Visits for anyone 51 and older to request care online for non-urgent symptoms. For details visit [email or web address].   Also download the MyChart app! Go to the app store, search "MyChart", open the app, select , and log in with your MyChart username and password.

## 2023-08-02 ENCOUNTER — Inpatient Hospital Stay: Payer: BC Managed Care – PPO

## 2023-08-02 VITALS — BP 112/47 | HR 76 | Temp 97.2°F | Resp 18

## 2023-08-02 DIAGNOSIS — E538 Deficiency of other specified B group vitamins: Secondary | ICD-10-CM

## 2023-08-02 DIAGNOSIS — D696 Thrombocytopenia, unspecified: Secondary | ICD-10-CM

## 2023-08-02 LAB — CBC WITH DIFFERENTIAL/PLATELET
Abs Immature Granulocytes: 0.02 10*3/uL (ref 0.00–0.07)
Basophils Absolute: 0 10*3/uL (ref 0.0–0.1)
Basophils Relative: 0 %
Eosinophils Absolute: 0.1 10*3/uL (ref 0.0–0.5)
Eosinophils Relative: 2 %
HCT: 40.2 % (ref 36.0–46.0)
Hemoglobin: 13.3 g/dL (ref 12.0–15.0)
Immature Granulocytes: 0 %
Lymphocytes Relative: 25 %
Lymphs Abs: 1.2 10*3/uL (ref 0.7–4.0)
MCH: 33.3 pg (ref 26.0–34.0)
MCHC: 33.1 g/dL (ref 30.0–36.0)
MCV: 100.8 fL — ABNORMAL HIGH (ref 80.0–100.0)
Monocytes Absolute: 0.4 10*3/uL (ref 0.1–1.0)
Monocytes Relative: 8 %
Neutro Abs: 3.2 10*3/uL (ref 1.7–7.7)
Neutrophils Relative %: 65 %
Platelets: 86 10*3/uL — ABNORMAL LOW (ref 150–400)
RBC: 3.99 MIL/uL (ref 3.87–5.11)
RDW: 12.8 % (ref 11.5–15.5)
WBC: 4.9 10*3/uL (ref 4.0–10.5)
nRBC: 0 % (ref 0.0–0.2)

## 2023-08-02 LAB — COMPREHENSIVE METABOLIC PANEL
ALT: 20 U/L (ref 0–44)
AST: 33 U/L (ref 15–41)
Albumin: 3.9 g/dL (ref 3.5–5.0)
Alkaline Phosphatase: 72 U/L (ref 38–126)
Anion gap: 5 (ref 5–15)
BUN: 24 mg/dL — ABNORMAL HIGH (ref 6–20)
CO2: 27 mmol/L (ref 22–32)
Calcium: 9.3 mg/dL (ref 8.9–10.3)
Chloride: 107 mmol/L (ref 98–111)
Creatinine, Ser: 1.55 mg/dL — ABNORMAL HIGH (ref 0.44–1.00)
GFR, Estimated: 39 mL/min — ABNORMAL LOW (ref >60)
Glucose, Bld: 209 mg/dL — ABNORMAL HIGH (ref 70–99)
Potassium: 4.2 mmol/L (ref 3.5–5.1)
Sodium: 139 mmol/L (ref 135–145)
Total Bilirubin: 1 mg/dL (ref <1.2)
Total Protein: 7.8 g/dL (ref 6.5–8.1)

## 2023-08-02 LAB — VITAMIN B12: Vitamin B-12: 1027 pg/mL — ABNORMAL HIGH (ref 180–914)

## 2023-08-02 MED ORDER — CYANOCOBALAMIN 1000 MCG/ML IJ SOLN
1000.0000 ug | Freq: Once | INTRAMUSCULAR | Status: AC
  Administered 2023-08-02: 1000 ug via INTRAMUSCULAR
  Filled 2023-08-02: qty 1

## 2023-08-02 NOTE — Telephone Encounter (Signed)
I have not. I wasn't aware about a time frame when needed. Please tell her I'm sorry, because I've been out of the office for stretches of time. When does she need this returned

## 2023-08-02 NOTE — Telephone Encounter (Signed)
Pt phoned back and LMOVM regarding whether or not you had viewed the paperwork or wrote a letter for her attorney. She is in court this morning. Please advise

## 2023-08-02 NOTE — Progress Notes (Signed)
Patient tolerated B12 injection with no complaints voiced.  Site clean and dry with no bruising or swelling noted at site.  See MAR for details.  Band aid applied.  Patient stable during and after injection.  Vss with discharge and left in satisfactory condition with no s/s of distress noted. All follow ups as scheduled.   Chelsea Estrada

## 2023-08-03 ENCOUNTER — Other Ambulatory Visit: Payer: BC Managed Care – PPO

## 2023-08-09 ENCOUNTER — Ambulatory Visit: Payer: BC Managed Care – PPO

## 2023-08-09 NOTE — Telephone Encounter (Signed)
See other note

## 2023-08-11 ENCOUNTER — Inpatient Hospital Stay: Payer: BC Managed Care – PPO | Attending: Oncology | Admitting: Oncology

## 2023-08-11 VITALS — BP 113/67 | HR 84 | Temp 97.6°F | Resp 18 | Wt 203.0 lb

## 2023-08-11 DIAGNOSIS — F32A Depression, unspecified: Secondary | ICD-10-CM | POA: Insufficient documentation

## 2023-08-11 DIAGNOSIS — D696 Thrombocytopenia, unspecified: Secondary | ICD-10-CM | POA: Diagnosis not present

## 2023-08-11 DIAGNOSIS — F419 Anxiety disorder, unspecified: Secondary | ICD-10-CM | POA: Insufficient documentation

## 2023-08-11 DIAGNOSIS — F418 Other specified anxiety disorders: Secondary | ICD-10-CM

## 2023-08-11 DIAGNOSIS — Z634 Disappearance and death of family member: Secondary | ICD-10-CM | POA: Insufficient documentation

## 2023-08-11 DIAGNOSIS — Z79899 Other long term (current) drug therapy: Secondary | ICD-10-CM | POA: Diagnosis not present

## 2023-08-11 DIAGNOSIS — R161 Splenomegaly, not elsewhere classified: Secondary | ICD-10-CM | POA: Insufficient documentation

## 2023-08-11 DIAGNOSIS — K7460 Unspecified cirrhosis of liver: Secondary | ICD-10-CM

## 2023-08-11 DIAGNOSIS — E538 Deficiency of other specified B group vitamins: Secondary | ICD-10-CM | POA: Diagnosis not present

## 2023-08-11 NOTE — Patient Instructions (Signed)
VISIT SUMMARY:  You came in today for a follow-up visit. We discussed your ongoing health issues, including your vitamin B12 deficiency, liver cirrhosis, and mental health. You reported no new symptoms and shared some positive updates about your kidney function and hemoglobin levels. We also talked about your ongoing grief and anxiety.  YOUR PLAN:  -VITAMIN B12 DEFICIENCY: Vitamin B12 deficiency means your body doesn't have enough of this essential vitamin, which can lead to anemia and other health issues. Your anemia has improved with the B12 shots, but your levels are now high. We will continue with three more monthly B12 shots and B12 pills.  -LIVER CIRRHOSIS: Liver cirrhosis is a condition where the liver is scarred and doesn't function properly. This is likely causing your low platelet count. You are already seeing a gastroenterologist for this, and there are no changes to your current management plan.  -DEPRESSION/ANXIETY: Depression and anxiety are mental health conditions that can affect your mood and overall well-being. Your grief from multiple losses is contributing to these feelings. You are currently taking Xanax, and we may consider adding an antidepressant in the future. Continue with your current mental health support.  INSTRUCTIONS:  Please follow up in three months to recheck your labs and assess your progress.

## 2023-08-11 NOTE — Assessment & Plan Note (Signed)
Likely secondary to liver cirrhosis and splenomegaly.  Also being contributed by vitamin B12 deficiency.  No active signs of bleeding or ecchymosis. -Improving currently -Continue to monitor

## 2023-08-11 NOTE — Assessment & Plan Note (Signed)
 Chronic, contributing to thrombocytopenia and splenomegaly. -No changes to current management.

## 2023-08-11 NOTE — Assessment & Plan Note (Signed)
Significant grief due to multiple losses. Currently on Xanax, potential future consideration for antidepressant. -Emotional support provided for the patient -Continue current mental health support. -Consider future antidepressant therapy in collaboration with primary care provider.

## 2023-08-11 NOTE — Assessment & Plan Note (Signed)
Patient had severe vitamin B12 deficiency, improving with supplementation.  Completed 4 weekly injections so far. -Continue oral vitamin B12 -Continue parenteral B12 injections for 3 more doses

## 2023-08-11 NOTE — Progress Notes (Signed)
[facility] at [facility] HEMATOLOGY FOLLOW-UP VISIT  Chelsea Stabile, MD  REASON FOR FOLLOW-UP: Thrombocytopenia  ASSESSMENT & PLAN:  Patient is a 57 year old female with liver cirrhosis, vitamin B12 deficiency and splenomegaly following for thrombocytopenia   Thrombocytopenia (HCC) Likely secondary to liver cirrhosis and splenomegaly.  Also being contributed by vitamin B12 deficiency.  No active signs of bleeding or ecchymosis. -Improving currently -Continue to monitor  Vitamin B12 deficiency Patient had severe vitamin B12 deficiency, improving with supplementation.  Completed 4 weekly injections so far. -Continue oral vitamin B12 -Continue parenteral B12 injections for 3 more doses  Hepatic cirrhosis Chronic, contributing to thrombocytopenia and splenomegaly. -No changes to current management.  Depression with anxiety Significant grief due to multiple losses. Currently on Xanax, potential future consideration for antidepressant. -Emotional support provided for the patient -Continue current mental health support. -Consider future antidepressant therapy in collaboration with primary care provider.   Orders Placed This Encounter  Procedures   Comprehensive metabolic panel    Standing Status:   Future    Standing Expiration Date:   08/10/2024   CBC with Differential/Platelet    Standing Status:   Future    Standing Expiration Date:   08/10/2024   Vitamin B12    Standing Status:   Future    Standing Expiration Date:   08/10/2024   Folate    Standing Status:   Future    Standing Expiration Date:   08/10/2024   Intrinsic Factor Antibodies    Standing Status:   Future    Standing Expiration Date:   08/10/2024   Methylmalonic acid, serum    Standing Status:   Future    Standing Expiration Date:   08/10/2024    The total time spent in the appointment was 30 minutes encounter with patients including review of chart and various tests results, discussions  about plan of care and coordination of care plan   All questions were answered. The patient knows to call the clinic with any problems, questions or concerns. No barriers to learning was detected.  Cindie Crumbly, MD 12/5/202412:04 PM    INTERVAL HISTORY: Chelsea Estrada 57 y.o. female is here for follow-up for thrombocytopenia.She reports no bleeding or other complaints. She has been receiving vitamin B12 shots.She is under the care of a gastroenterologist for her liver cirrhosis and the etiology of the cirrhosis remains unclear.The patient also reports significant grief and loss, including the death of her husband two years ago, which has contributed to ongoing anxiety and depression. She denies any suicidal ideations.   She has no other complaints today and has been doing good.   I have reviewed the past medical history, past surgical history, social history and family history with the patient   ALLERGIES:  is allergic to prednisone, clindamycin/lincomycin, penicillins, doxycycline, and sulfamethoxazole-trimethoprim.  MEDICATIONS:  Current Outpatient Medications  Medication Sig Dispense Refill   acetaminophen (TYLENOL) 500 MG tablet Take 1,000 mg by mouth every 6 (six) hours as needed for moderate pain.     ALPRAZolam (XANAX) 0.5 MG tablet      atorvastatin (LIPITOR) 10 MG tablet Take 10 mg by mouth daily.      augmented betamethasone dipropionate (DIPROLENE-AF) 0.05 % cream Apply 1 application topically See admin instructions. Apply once daily, may apply up to 4 times a day as needed for psoriasis     Cyanocobalamin 1000 MCG CAPS Take 1,000 mg by mouth daily. 90 capsule 0   diphenhydrAMINE (BENADRYL) 25 MG  tablet Take 25 mg by mouth daily as needed for allergies.     fenofibrate 160 MG tablet Take 160 mg by mouth at bedtime.     furosemide (LASIX) 20 MG tablet Take 20 mg by mouth every other day.     HYDROcodone-acetaminophen (NORCO/VICODIN) 5-325 MG tablet Take 1 tablet by mouth  every 6 (six) hours as needed for moderate pain. For lupus     hydroxychloroquine (PLAQUENIL) 200 MG tablet Take 200 mg by mouth 2 (two) times daily.      Krill Oil 500 MG CAPS Take 500 mg by mouth in the morning and at bedtime.     Lancets (ONETOUCH DELICA PLUS LANCET33G) MISC Apply topically.     levothyroxine (SYNTHROID, LEVOTHROID) 25 MCG tablet Take 25 mcg by mouth daily before breakfast.     magnesium oxide (MAG-OX) 400 MG tablet Take 400 mg by mouth daily.     metoprolol succinate (TOPROL-XL) 50 MG 24 hr tablet Take 50 mg by mouth every morning. Take with or immediately following a meal.     Milk Thistle 1000 MG CAPS Take 1,000 mg by mouth daily.      ONETOUCH ULTRA TEST test strip      Polyethyl Glycol-Propyl Glycol (SYSTANE OP) Place 1 drop into both eyes daily as needed (dry eyes).     Probiotic Product (PROBIOTIC PO) Take 1 capsule by mouth daily.     Semaglutide, 1 MG/DOSE, (OZEMPIC, 1 MG/DOSE,) 2 MG/1.5ML SOPN      tiZANidine (ZANAFLEX) 4 MG tablet Take 4 mg by mouth at bedtime.     TREMFYA 100 MG/ML SOPN Inject 1 Dose into the skin every 8 (eight) weeks.     No current facility-administered medications for this visit.     REVIEW OF SYSTEMS:   Constitutional: Denies fevers, chills or night sweats Eyes: Denies blurriness of vision Ears, nose, mouth, throat, and face: Denies mucositis or sore throat Respiratory: Denies cough, dyspnea or wheezes Cardiovascular: Denies palpitation, chest discomfort or lower extremity swelling Gastrointestinal:  Denies nausea, heartburn or change in bowel habits Skin: Denies abnormal skin rashes Lymphatics: Denies new lymphadenopathy or easy bruising Neurological:Denies numbness, tingling or new weaknesses Behavioral/Psych: Mood is stable, no new changes  All other systems were reviewed with the patient and are negative.  PHYSICAL EXAMINATION:   Vitals:   08/11/23 0952  BP: 113/67  Pulse: 84  Resp: 18  Temp: 97.6 F (36.4 C)  SpO2:  99%    GENERAL:alert, no distress and comfortable LUNGS: clear to auscultation and percussion with normal breathing effort HEART: regular rate & rhythm and no murmurs and no lower extremity edema ABDOMEN:abdomen soft, non-tender and normal bowel sounds Musculoskeletal:no cyanosis of digits and no clubbing  NEURO: alert & oriented x 3 with fluent speech  LABORATORY DATA:  I have reviewed the data as listed  Lab Results  Component Value Date   WBC 4.9 08/02/2023   NEUTROABS 3.2 08/02/2023   HGB 13.3 08/02/2023   HCT 40.2 08/02/2023   MCV 100.8 (H) 08/02/2023   PLT 86 (L) 08/02/2023      Component Value Date/Time   NA 139 08/02/2023 1334   NA 143 06/22/2023 1001   K 4.2 08/02/2023 1334   CL 107 08/02/2023 1334   CO2 27 08/02/2023 1334   GLUCOSE 209 (H) 08/02/2023 1334   BUN 24 (H) 08/02/2023 1334   BUN 31 (H) 06/22/2023 1001   CREATININE 1.55 (H) 08/02/2023 1334   CALCIUM 9.3 08/02/2023 1334  PROT 7.8 08/02/2023 1334   PROT 7.2 06/22/2023 1001   ALBUMIN 3.9 08/02/2023 1334   ALBUMIN 4.2 06/22/2023 1001   AST 33 08/02/2023 1334   ALT 20 08/02/2023 1334   ALKPHOS 72 08/02/2023 1334   BILITOT 1.0 08/02/2023 1334   BILITOT 0.5 06/22/2023 1001   GFRNONAA 39 (L) 08/02/2023 1334   GFRAA 110 02/16/2019 1459       Chemistry      Component Value Date/Time   NA 139 08/02/2023 1334   NA 143 06/22/2023 1001   K 4.2 08/02/2023 1334   CL 107 08/02/2023 1334   CO2 27 08/02/2023 1334   BUN 24 (H) 08/02/2023 1334   BUN 31 (H) 06/22/2023 1001   CREATININE 1.55 (H) 08/02/2023 1334      Component Value Date/Time   CALCIUM 9.3 08/02/2023 1334   ALKPHOS 72 08/02/2023 1334   AST 33 08/02/2023 1334   ALT 20 08/02/2023 1334   BILITOT 1.0 08/02/2023 1334   BILITOT 0.5 06/22/2023 1001      Latest Reference Range & Units 10/10/15 11:06 07/06/23 08:42 08/02/23 13:34  Ferritin 11 - 307 ng/mL 30 59   Iron Saturation 15 - 55 % 14 (L)    Folate >5.9 ng/mL  30.5   Vitamin B12 180  - 914 pg/mL  141 (L) 1,027 (H)  (L): Data is abnormally low (H): Data is abnormally high  Latest Reference Range & Units 04/28/15 11:07 07/06/23 08:42  Hep A Ab, IgM NON REACTIVE   NON REACTIVE  Hepatitis B Surface Ag NON REACTIVE  Negative NON REACTIVE  Hep B Core Ab, IgM NON REACTIVE   NON REACTIVE  Hep C Virus Ab 0.0 - 0.9 s/co ratio 0.2   HCV Ab NON REACTIVE   NON REACTIVE  HIV Screen 4th Generation wRfx Non Reactive   Non Reactive   Radiology:  EXAM: ABDOMEN ULTRASOUND COMPLETE   COMPARISON:  Ultrasound abdomen 09/24/2022   FINDINGS: Gallbladder: Surgically absent   Common bile duct: Diameter: 4.7 mm   Liver: Coarsened echogenicity and nodular contour. No focal lesion. Portal vein is patent on color Doppler imaging with normal direction of blood flow towards the liver.   IVC: No abnormality visualized.   Pancreas: Visualized portion unremarkable.   Spleen: Enlarged measuring 17.6 cm   Right Kidney: Length: 11.2 cm. Echogenicity within normal limits. No mass or hydronephrosis visualized.   Left Kidney: Length: 12.5 cm. Echogenicity within normal limits. No mass or hydronephrosis visualized.   Abdominal aorta: No aneurysm visualized.   Other findings: None.   IMPRESSION: 1. Cirrhotic morphology of the liver. No focal lesion. 2. Splenomegaly.

## 2023-08-24 ENCOUNTER — Other Ambulatory Visit: Payer: Self-pay | Admitting: Oncology

## 2023-09-05 ENCOUNTER — Inpatient Hospital Stay: Payer: BC Managed Care – PPO

## 2023-09-05 ENCOUNTER — Encounter: Payer: Self-pay | Admitting: Oncology

## 2023-09-05 VITALS — BP 129/60 | HR 85 | Temp 97.8°F | Resp 18

## 2023-09-05 DIAGNOSIS — E538 Deficiency of other specified B group vitamins: Secondary | ICD-10-CM | POA: Diagnosis not present

## 2023-09-05 MED ORDER — CYANOCOBALAMIN 1000 MCG/ML IJ SOLN
1000.0000 ug | Freq: Once | INTRAMUSCULAR | Status: AC
  Administered 2023-09-05: 1000 ug via INTRAMUSCULAR
  Filled 2023-09-05: qty 1

## 2023-09-05 NOTE — Progress Notes (Signed)
Patient tolerated Vitamin B 12 injection with no complaints voiced.  Site clean and dry with no bruising or swelling noted.  No complaints of pain.  Discharged with vital signs stable and no signs or symptoms of distress noted.   ?

## 2023-09-05 NOTE — Patient Instructions (Signed)
 [facility] [place] - A DEPT OF [facility]  Discharge Instructions: Thank you for choosing [facility] to provide your oncology and hematology care.  If you have a lab appointment with the Cancer Center - please note that after April 8th, 2024, all labs will be drawn in the cancer center.  You do not have to check in or register with the main entrance as you have in the past but will complete your check-in in the cancer center.  Wear comfortable clothing and clothing appropriate for easy access to any Portacath or PICC line.   We strive to give you quality time with your provider. You may need to reschedule your appointment if you arrive late (15 or more minutes).  Arriving late affects you and other patients whose appointments are after yours.  Also, if you miss three or more appointments without notifying the office, you may be dismissed from the clinic at the provider's discretion.      For prescription refill requests, have your pharmacy contact our office and allow 72 hours for refills to be completed.    Today you received the following:  Vitamin B12.  Vitamin B12 Injection What is this medication? Vitamin B12 (VAHY tuh min B12) prevents and treats low vitamin B12 levels in your body. It is used in people who do not get enough vitamin B12 from their diet or when their digestive tract does not absorb enough. Vitamin B12 plays an important role in maintaining the health of your nervous system and red blood cells. This medicine may be used for other purposes; ask your health care provider or pharmacist if you have questions. COMMON BRAND NAME(S): B-12 Compliance Kit, B-12 Injection Kit, Cyomin, Dodex, LA-12, Nutri-Twelve, Physicians EZ Use B-12, Primabalt, Vitamin Deficiency Injectable System - B12 What should I tell my care team before I take this medication? They need to know if you have any of these conditions: Kidney disease Leber's  disease Megaloblastic anemia An unusual or allergic reaction to cyanocobalamin, cobalt, other medications, foods, dyes, or preservatives Pregnant or trying to get pregnant Breast-feeding How should I use this medication? This medication is injected into a muscle or deeply under the skin. It is usually given in a clinic or care team's office. However, your care team may teach you how to inject yourself. Follow all instructions. Talk to your care team about the use of this medication in children. Special care may be needed. Overdosage: If you think you have taken too much of this medicine contact a poison control center or emergency room at once. NOTE: This medicine is only for you. Do not share this medicine with others. What if I miss a dose? If you are given your dose at a clinic or care team's office, call to reschedule your appointment. If you give your own injections, and you miss a dose, take it as soon as you can. If it is almost time for your next dose, take only that dose. Do not take double or extra doses. What may interact with this medication? Alcohol Colchicine This list may not describe all possible interactions. Give your health care provider a list of all the medicines, herbs, non-prescription drugs, or dietary supplements you use. Also tell them if you smoke, drink alcohol, or use illegal drugs. Some items may interact with your medicine. What should I watch for while using this medication? Visit your care team regularly. You may need blood work done while you are  taking this medication. You may need to follow a special diet. Talk to your care team. Limit your alcohol intake and avoid smoking to get the best benefit. What side effects may I notice from receiving this medication? Side effects that you should report to your care team as soon as possible: Allergic reactions--skin rash, itching, hives, swelling of the face, lips, tongue, or throat Swelling of the ankles, hands, or  feet Trouble breathing Side effects that usually do not require medical attention (report to your care team if they continue or are bothersome): Diarrhea This list may not describe all possible side effects. Call your doctor for medical advice about side effects. You may report side effects to FDA at 1-800-FDA-1088. Where should I keep my medication? Keep out of the reach of children. Store at room temperature between 15 and 30 degrees C (59 and 85 degrees F). Protect from light. Throw away any unused medication after the expiration date. NOTE: This sheet is a summary. It may not cover all possible information. If you have questions about this medicine, talk to your doctor, pharmacist, or health care provider.  2024 Elsevier/Gold Standard (2021-05-05 00:00:00)     To help prevent nausea and vomiting after your treatment, we encourage you to take your nausea medication as directed.  BELOW ARE SYMPTOMS THAT SHOULD BE REPORTED IMMEDIATELY: *FEVER GREATER THAN 100.4 F (38 C) OR HIGHER *CHILLS OR SWEATING *NAUSEA AND VOMITING THAT IS NOT CONTROLLED WITH YOUR NAUSEA MEDICATION *UNUSUAL SHORTNESS OF BREATH *UNUSUAL BRUISING OR BLEEDING *URINARY PROBLEMS (pain or burning when urinating, or frequent urination) *BOWEL PROBLEMS (unusual diarrhea, constipation, pain near the anus) TENDERNESS IN MOUTH AND THROAT WITH OR WITHOUT PRESENCE OF ULCERS (sore throat, sores in mouth, or a toothache) UNUSUAL RASH, SWELLING OR PAIN  UNUSUAL VAGINAL DISCHARGE OR ITCHING   Items with * indicate a potential emergency and should be followed up as soon as possible or go to the Emergency Department if any problems should occur.  Please show the CHEMOTHERAPY ALERT CARD or IMMUNOTHERAPY ALERT CARD at check-in to the Emergency Department and triage nurse.  Should you have questions after your visit or need to cancel or reschedule your appointment, please contact [facility] [place] - A DEPT OF [facility] [phone]  and follow the prompts.  Office hours are 8:00 a.m. to 4:30 p.m. Monday - Friday. Please note that voicemails left after 4:00 p.m. may not be returned until the following business day.  We are closed weekends and major holidays. You have access to a nurse at all times for urgent questions. Please call the main number to the clinic [phone] and follow the prompts.  For any non-urgent questions, you may also contact your provider using MyChart. We now offer e-Visits for anyone 99 and older to request care online for non-urgent symptoms. For details visit [email or web address].   Also download the MyChart app! Go to the app store, search "MyChart", open the app, select [place], and log in with your MyChart username and password.

## 2023-10-04 ENCOUNTER — Encounter: Payer: Self-pay | Admitting: Oncology

## 2023-10-06 ENCOUNTER — Inpatient Hospital Stay: Payer: 59 | Attending: Oncology

## 2023-10-06 ENCOUNTER — Encounter: Payer: Self-pay | Admitting: Oncology

## 2023-10-06 VITALS — BP 126/74 | HR 78 | Temp 97.6°F | Resp 16

## 2023-10-06 DIAGNOSIS — E538 Deficiency of other specified B group vitamins: Secondary | ICD-10-CM | POA: Diagnosis present

## 2023-10-06 DIAGNOSIS — D696 Thrombocytopenia, unspecified: Secondary | ICD-10-CM | POA: Diagnosis present

## 2023-10-06 MED ORDER — CYANOCOBALAMIN 1000 MCG/ML IJ SOLN
1000.0000 ug | Freq: Once | INTRAMUSCULAR | Status: AC
Start: 2023-10-06 — End: 2023-10-06
  Administered 2023-10-06: 1000 ug via INTRAMUSCULAR
  Filled 2023-10-06: qty 1

## 2023-10-06 NOTE — Progress Notes (Signed)
Patient tolerated Vitamin B 12 injection with no complaints voiced.  Site clean and dry with no bruising or swelling noted.  No complaints of pain.  Discharged with vital signs stable and no signs or symptoms of distress noted.   ?

## 2023-10-06 NOTE — Patient Instructions (Signed)
[facility] [place] - A DEPT OF [facility]  Discharge Instructions: Thank you for choosing [facility] to provide your oncology and hematology care.  If you have a lab appointment with the Cancer Center - please note that after April 8th, 2024, all labs will be drawn in the cancer center.  You do not have to check in or register with the main entrance as you have in the past but will complete your check-in in the cancer center.  Wear comfortable clothing and clothing appropriate for easy access to any Portacath or PICC line.   We strive to give you quality time with your provider. You may need to reschedule your appointment if you arrive late (15 or more minutes).  Arriving late affects you and other patients whose appointments are after yours.  Also, if you miss three or more appointments without notifying the office, you may be dismissed from the clinic at the provider's discretion.      For prescription refill requests, have your pharmacy contact our office and allow 72 hours for refills to be completed.    Today you received the following: Vitamin B12   Vitamin B12 Injection What is this medication? Vitamin B12 (VAHY tuh min B12) prevents and treats low vitamin B12 levels in your body. It is used in people who do not get enough vitamin B12 from their diet or when their digestive tract does not absorb enough. Vitamin B12 plays an important role in maintaining the health of your nervous system and red blood cells. This medicine may be used for other purposes; ask your health care provider or pharmacist if you have questions. COMMON BRAND NAME(S): B-12 Compliance Kit, B-12 Injection Kit, Cyomin, Dodex, LA-12, Nutri-Twelve, Physicians EZ Use B-12, Primabalt, Vitamin Deficiency Injectable System - B12 What should I tell my care team before I take this medication? They need to know if you have any of these conditions: Kidney disease Leber's  disease Megaloblastic anemia An unusual or allergic reaction to cyanocobalamin, cobalt, other medications, foods, dyes, or preservatives Pregnant or trying to get pregnant Breast-feeding How should I use this medication? This medication is injected into a muscle or deeply under the skin. It is usually given in a clinic or care team's office. However, your care team may teach you how to inject yourself. Follow all instructions. Talk to your care team about the use of this medication in children. Special care may be needed. Overdosage: If you think you have taken too much of this medicine contact a poison control center or emergency room at once. NOTE: This medicine is only for you. Do not share this medicine with others. What if I miss a dose? If you are given your dose at a clinic or care team's office, call to reschedule your appointment. If you give your own injections, and you miss a dose, take it as soon as you can. If it is almost time for your next dose, take only that dose. Do not take double or extra doses. What may interact with this medication? Alcohol Colchicine This list may not describe all possible interactions. Give your health care provider a list of all the medicines, herbs, non-prescription drugs, or dietary supplements you use. Also tell them if you smoke, drink alcohol, or use illegal drugs. Some items may interact with your medicine. What should I watch for while using this medication? Visit your care team regularly. You may need blood work done while you are  taking this medication. You may need to follow a special diet. Talk to your care team. Limit your alcohol intake and avoid smoking to get the best benefit. What side effects may I notice from receiving this medication? Side effects that you should report to your care team as soon as possible: Allergic reactions--skin rash, itching, hives, swelling of the face, lips, tongue, or throat Swelling of the ankles, hands, or  feet Trouble breathing Side effects that usually do not require medical attention (report to your care team if they continue or are bothersome): Diarrhea This list may not describe all possible side effects. Call your doctor for medical advice about side effects. You may report side effects to FDA at 1-800-FDA-1088. Where should I keep my medication? Keep out of the reach of children. Store at room temperature between 15 and 30 degrees C (59 and 85 degrees F). Protect from light. Throw away any unused medication after the expiration date. NOTE: This sheet is a summary. It may not cover all possible information. If you have questions about this medicine, talk to your doctor, pharmacist, or health care provider.  2024 Elsevier/Gold Standard (2021-05-05 00:00:00)    To help prevent nausea and vomiting after your treatment, we encourage you to take your nausea medication as directed.  BELOW ARE SYMPTOMS THAT SHOULD BE REPORTED IMMEDIATELY: *FEVER GREATER THAN 100.4 F (38 C) OR HIGHER *CHILLS OR SWEATING *NAUSEA AND VOMITING THAT IS NOT CONTROLLED WITH YOUR NAUSEA MEDICATION *UNUSUAL SHORTNESS OF BREATH *UNUSUAL BRUISING OR BLEEDING *URINARY PROBLEMS (pain or burning when urinating, or frequent urination) *BOWEL PROBLEMS (unusual diarrhea, constipation, pain near the anus) TENDERNESS IN MOUTH AND THROAT WITH OR WITHOUT PRESENCE OF ULCERS (sore throat, sores in mouth, or a toothache) UNUSUAL RASH, SWELLING OR PAIN  UNUSUAL VAGINAL DISCHARGE OR ITCHING   Items with * indicate a potential emergency and should be followed up as soon as possible or go to the Emergency Department if any problems should occur.  Please show the CHEMOTHERAPY ALERT CARD or IMMUNOTHERAPY ALERT CARD at check-in to the Emergency Department and triage nurse.  Should you have questions after your visit or need to cancel or reschedule your appointment, please contact [facility] [place] - A DEPT OF [facility] [phone]  and follow the prompts.  Office hours are 8:00 a.m. to 4:30 p.m. Monday - Friday. Please note that voicemails left after 4:00 p.m. may not be returned until the following business day.  We are closed weekends and major holidays. You have access to a nurse at all times for urgent questions. Please call the main number to the clinic [phone] and follow the prompts.  For any non-urgent questions, you may also contact your provider using MyChart. We now offer e-Visits for anyone 58 and older to request care online for non-urgent symptoms. For details visit [email or web address].   Also download the MyChart app! Go to the app store, search "MyChart", open the app, select [place], and log in with your MyChart username and password.

## 2023-11-07 ENCOUNTER — Inpatient Hospital Stay: Payer: 59 | Attending: Oncology

## 2023-11-07 DIAGNOSIS — D696 Thrombocytopenia, unspecified: Secondary | ICD-10-CM | POA: Diagnosis present

## 2023-11-07 DIAGNOSIS — K7460 Unspecified cirrhosis of liver: Secondary | ICD-10-CM | POA: Insufficient documentation

## 2023-11-07 DIAGNOSIS — E538 Deficiency of other specified B group vitamins: Secondary | ICD-10-CM | POA: Insufficient documentation

## 2023-11-07 DIAGNOSIS — D72819 Decreased white blood cell count, unspecified: Secondary | ICD-10-CM | POA: Insufficient documentation

## 2023-11-07 DIAGNOSIS — R635 Abnormal weight gain: Secondary | ICD-10-CM | POA: Insufficient documentation

## 2023-11-07 DIAGNOSIS — R5383 Other fatigue: Secondary | ICD-10-CM | POA: Diagnosis not present

## 2023-11-07 DIAGNOSIS — R161 Splenomegaly, not elsewhere classified: Secondary | ICD-10-CM | POA: Diagnosis not present

## 2023-11-07 LAB — CBC WITH DIFFERENTIAL/PLATELET
Abs Immature Granulocytes: 0.02 10*3/uL (ref 0.00–0.07)
Basophils Absolute: 0 10*3/uL (ref 0.0–0.1)
Basophils Relative: 1 %
Eosinophils Absolute: 0.1 10*3/uL (ref 0.0–0.5)
Eosinophils Relative: 2 %
HCT: 38.4 % (ref 36.0–46.0)
Hemoglobin: 12.9 g/dL (ref 12.0–15.0)
Immature Granulocytes: 1 %
Lymphocytes Relative: 24 %
Lymphs Abs: 0.9 10*3/uL (ref 0.7–4.0)
MCH: 32.3 pg (ref 26.0–34.0)
MCHC: 33.6 g/dL (ref 30.0–36.0)
MCV: 96.2 fL (ref 80.0–100.0)
Monocytes Absolute: 0.4 10*3/uL (ref 0.1–1.0)
Monocytes Relative: 9 %
Neutro Abs: 2.4 10*3/uL (ref 1.7–7.7)
Neutrophils Relative %: 63 %
Platelets: 81 10*3/uL — ABNORMAL LOW (ref 150–400)
RBC: 3.99 MIL/uL (ref 3.87–5.11)
RDW: 12.8 % (ref 11.5–15.5)
WBC: 3.8 10*3/uL — ABNORMAL LOW (ref 4.0–10.5)
nRBC: 0 % (ref 0.0–0.2)

## 2023-11-07 LAB — COMPREHENSIVE METABOLIC PANEL
ALT: 31 U/L (ref 0–44)
AST: 48 U/L — ABNORMAL HIGH (ref 15–41)
Albumin: 3.7 g/dL (ref 3.5–5.0)
Alkaline Phosphatase: 84 U/L (ref 38–126)
Anion gap: 11 (ref 5–15)
BUN: 25 mg/dL — ABNORMAL HIGH (ref 6–20)
CO2: 24 mmol/L (ref 22–32)
Calcium: 9.4 mg/dL (ref 8.9–10.3)
Chloride: 99 mmol/L (ref 98–111)
Creatinine, Ser: 1.26 mg/dL — ABNORMAL HIGH (ref 0.44–1.00)
GFR, Estimated: 50 mL/min — ABNORMAL LOW (ref >60)
Glucose, Bld: 252 mg/dL — ABNORMAL HIGH (ref 70–99)
Potassium: 3.8 mmol/L (ref 3.5–5.1)
Sodium: 134 mmol/L — ABNORMAL LOW (ref 135–145)
Total Bilirubin: 0.9 mg/dL (ref 0.0–1.2)
Total Protein: 7.2 g/dL (ref 6.5–8.1)

## 2023-11-07 LAB — FOLATE: Folate: 31.1 ng/mL (ref >5.9)

## 2023-11-07 LAB — VITAMIN B12: Vitamin B-12: 695 pg/mL (ref 180–914)

## 2023-11-09 ENCOUNTER — Other Ambulatory Visit (HOSPITAL_COMMUNITY): Payer: Self-pay | Admitting: Internal Medicine

## 2023-11-09 DIAGNOSIS — Z1231 Encounter for screening mammogram for malignant neoplasm of breast: Secondary | ICD-10-CM

## 2023-11-09 LAB — INTRINSIC FACTOR ANTIBODIES: Intrinsic Factor: 1.1 [AU]/ml (ref 0.0–1.1)

## 2023-11-10 LAB — METHYLMALONIC ACID, SERUM: Methylmalonic Acid, Quantitative: 204 nmol/L (ref 0–378)

## 2023-11-14 ENCOUNTER — Inpatient Hospital Stay: Payer: 59

## 2023-11-14 ENCOUNTER — Ambulatory Visit (HOSPITAL_COMMUNITY)
Admission: RE | Admit: 2023-11-14 | Discharge: 2023-11-14 | Disposition: A | Discharge status: Home / Self Care | Source: Ambulatory Visit | Attending: Internal Medicine | Admitting: Internal Medicine

## 2023-11-14 ENCOUNTER — Inpatient Hospital Stay (HOSPITAL_BASED_OUTPATIENT_CLINIC_OR_DEPARTMENT_OTHER): Payer: 59 | Admitting: Oncology

## 2023-11-14 VITALS — BP 112/62 | HR 72 | Temp 98.0°F | Resp 18 | Wt 206.8 lb

## 2023-11-14 DIAGNOSIS — Z789 Other specified health status: Secondary | ICD-10-CM | POA: Insufficient documentation

## 2023-11-14 DIAGNOSIS — Z1231 Encounter for screening mammogram for malignant neoplasm of breast: Secondary | ICD-10-CM | POA: Diagnosis present

## 2023-11-14 DIAGNOSIS — D696 Thrombocytopenia, unspecified: Secondary | ICD-10-CM | POA: Diagnosis not present

## 2023-11-14 DIAGNOSIS — D72819 Decreased white blood cell count, unspecified: Secondary | ICD-10-CM | POA: Insufficient documentation

## 2023-11-14 DIAGNOSIS — R161 Splenomegaly, not elsewhere classified: Secondary | ICD-10-CM

## 2023-11-14 DIAGNOSIS — K7460 Unspecified cirrhosis of liver: Secondary | ICD-10-CM | POA: Diagnosis not present

## 2023-11-14 DIAGNOSIS — E538 Deficiency of other specified B group vitamins: Secondary | ICD-10-CM

## 2023-11-14 MED ORDER — CYANOCOBALAMIN 1000 MCG/ML IJ SOLN
1000.0000 ug | Freq: Once | INTRAMUSCULAR | Status: AC
Start: 2023-11-14 — End: 2023-11-14
  Administered 2023-11-14: 1000 ug via INTRAMUSCULAR
  Filled 2023-11-14: qty 1

## 2023-11-14 NOTE — Assessment & Plan Note (Signed)
 Likely secondary to liver cirrhosis and splenomegaly.  Also being contributed by vitamin B12 deficiency.  No active signs of bleeding or ecchymosis. -Stable currently -Continue to monitor

## 2023-11-14 NOTE — Assessment & Plan Note (Signed)
 Chronic, contributing to thrombocytopenia and splenomegaly. -No changes to current management. -Follow up with GI

## 2023-11-14 NOTE — Assessment & Plan Note (Signed)
 Difficulty with weight loss despite Ozempic and healthy lifestyle. Slight weight gain. Considering nutritionist consultation. - Refer to a nutritionist for dietary counseling. -Discussed healthy eating habits

## 2023-11-14 NOTE — Progress Notes (Signed)
 [facility] HEMATOLOGY FOLLOW-UP VISIT  Chelsea Stabile, MD  REASON FOR FOLLOW-UP: Thrombocytopenia  ASSESSMENT & PLAN:  Patient is a 58 year old female with liver cirrhosis, vitamin B12 deficiency and splenomegaly following for thrombocytopenia   Thrombocytopenia (HCC) Likely secondary to liver cirrhosis and splenomegaly.  Also being contributed by vitamin B12 deficiency.  No active signs of bleeding or ecchymosis. -Stable currently -Continue to monitor  Vitamin B12 deficiency Patient had severe vitamin B12 deficiency, improving with supplementation.  Completed 4 weekly injections so far and is getting monthly injections -Continue oral vitamin B12 -Continue parenteral B12 injections  Return to clinic in 3 months with labs  Hepatic cirrhosis Chronic, contributing to thrombocytopenia and splenomegaly. -No changes to current management. -Follow up with GI  Splenomegaly Likely secondary to liver cirrhosis, contributing to thrombocytopenia.  Recent ultrasound showed a size of 17.5 cm -No changes to current management.   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 Stable. No signs of infection or B symptoms. -Stable now -Continue to monitor  Advised about management of weight Difficulty with weight loss despite Ozempic and healthy lifestyle. Slight weight gain. Considering nutritionist consultation. - Refer to a nutritionist for dietary counseling. -Discussed healthy eating habits    Orders Placed This Encounter  Procedures   Ferritin    Standing Status:   Future    Expected Date:   02/13/2024    Expiration Date:   11/13/2024   Folate    Standing Status:   Future    Expected Date:   02/13/2024    Expiration Date:   11/13/2024   Vitamin B12    Standing Status:   Future    Expected Date:   02/13/2024    Expiration Date:   11/13/2024   CBC with Differential/Platelet    Standing Status:   Future    Expected Date:    02/13/2024    Expiration Date:   11/13/2024   Comprehensive metabolic panel    Standing Status:   Future    Expected Date:   02/13/2024    Expiration Date:   11/13/2024   Iron and TIBC    Standing Status:   Future    Expected Date:   02/13/2024    Expiration Date:   11/13/2024    The total time spent in the appointment was 20 minutes encounter with patients including review of chart and various tests results, discussions about plan of care and coordination of care plan   All questions were answered. The patient knows to call the clinic with any problems, questions or concerns. No barriers to learning was detected.  Cindie Crumbly, MD 3/10/20251:59 PM    INTERVAL HISTORY: Chelsea Estrada 58 y.o. female is here for follow-up for thrombocytopenia.  She has a history of liver cirrhosis, lupus, psoriatic arthritis, and osteo arthritis.  She reports some fatigue and weight gain despite a healthy diet and regular exercise.She reports feeling 'okay' overall, but is currently experiencing laryngitis and fatigue after a 'long, busy weekend.' She reports eating small, balanced meals and walking two miles several times a week. She also does all of her own yard work.  Overall, she is doing well and has no other complaints.   I have reviewed the past medical history, past surgical history, social history and family history with the patient   ALLERGIES:  is allergic to prednisone, clindamycin/lincomycin, penicillamine, penicillins, doxycycline, and sulfamethoxazole-trimethoprim.  MEDICATIONS:  Current Outpatient Medications  Medication Sig Dispense Refill  acetaminophen (TYLENOL) 500 MG tablet Take 1,000 mg by mouth every 6 (six) hours as needed for moderate pain.     ALPRAZolam (XANAX) 0.5 MG tablet Take 0.5 mg by mouth daily.     atorvastatin (LIPITOR) 10 MG tablet Take 10 mg by mouth daily.      augmented betamethasone dipropionate (DIPROLENE-AF) 0.05 % cream Apply 1 application topically See admin  instructions. Apply once daily, may apply up to 4 times a day as needed for psoriasis     Cyanocobalamin (VITAMIN B-12) 1000 MCG SUBL TAKE ONE (1) TABLET BY MOUTH EVERY DAY 90 tablet 3   diphenhydrAMINE (BENADRYL) 25 MG tablet Take 25 mg by mouth daily as needed for allergies.     fenofibrate 160 MG tablet Take 160 mg by mouth at bedtime.     furosemide (LASIX) 20 MG tablet Take 20 mg by mouth every other day. If gaining in between take two     HYDROcodone-acetaminophen (NORCO/VICODIN) 5-325 MG tablet Take 1 tablet by mouth every 6 (six) hours as needed for moderate pain. For lupus     hydroxychloroquine (PLAQUENIL) 200 MG tablet Take 200 mg by mouth 2 (two) times daily.      Krill Oil 500 MG CAPS Take 500 mg by mouth in the morning and at bedtime.     Lancets (ONETOUCH DELICA PLUS LANCET33G) MISC Apply topically.     levothyroxine (SYNTHROID, LEVOTHROID) 25 MCG tablet Take 25 mcg by mouth daily before breakfast.     magnesium oxide (MAG-OX) 400 MG tablet Take 400 mg by mouth daily.     metoprolol succinate (TOPROL-XL) 50 MG 24 hr tablet Take 50 mg by mouth every morning. Take with or immediately following a meal.     Milk Thistle 1000 MG CAPS Take 1,000 mg by mouth daily.      MOUNJARO 5 MG/0.5ML Pen SMARTSIG:5 Milligram(s) SUB-Q Once a Week     ONETOUCH ULTRA TEST test strip      Polyethyl Glycol-Propyl Glycol (SYSTANE OP) Place 1 drop into both eyes daily as needed (dry eyes).     Probiotic Product (PROBIOTIC PO) Take 1 capsule by mouth daily.     tiZANidine (ZANAFLEX) 4 MG tablet Take 4 mg by mouth at bedtime.     TREMFYA 100 MG/ML SOPN Inject 1 Dose into the skin every 8 (eight) weeks.     No current facility-administered medications for this visit.   Facility-Administered Medications Ordered in Other Visits  Medication Dose Route Frequency Provider Last Rate Last Admin   cyanocobalamin (VITAMIN B12) injection 1,000 mcg  1,000 mcg Intramuscular Once Cindie Crumbly, MD         REVIEW  OF SYSTEMS:   Constitutional: Denies fevers, chills or night sweats Eyes: Denies blurriness of vision Ears, nose, mouth, throat, and face: Denies mucositis or sore throat Respiratory: Denies cough, dyspnea or wheezes Cardiovascular: Denies palpitation, chest discomfort or lower extremity swelling Gastrointestinal:  Denies nausea, heartburn or change in bowel habits Skin: Denies abnormal skin rashes Lymphatics: Denies new lymphadenopathy or easy bruising Neurological:Denies numbness, tingling or new weaknesses Behavioral/Psych: Mood is stable, no new changes  All other systems were reviewed with the patient and are negative.  PHYSICAL EXAMINATION:   Vitals:   11/14/23 1332  BP: 112/62  Pulse: 72  Resp: 18  Temp: 98 F (36.7 C)  SpO2: 99%    GENERAL:alert, no distress and comfortable. Hoarse voice LUNGS: clear to auscultation and percussion with normal breathing effort HEART: regular rate &  rhythm and no murmurs and no lower extremity edema ABDOMEN:abdomen soft, non-tender and normal bowel sounds Musculoskeletal:no cyanosis of digits and no clubbing  NEURO: alert & oriented x 3 with fluent speech  LABORATORY DATA:  I have reviewed the data as listed  Lab Results  Component Value Date   WBC 3.8 (L) 11/07/2023   NEUTROABS 2.4 11/07/2023   HGB 12.9 11/07/2023   HCT 38.4 11/07/2023   MCV 96.2 11/07/2023   PLT 81 (L) 11/07/2023       Chemistry      Component Value Date/Time   NA 134 (L) 11/07/2023 1058   NA 143 06/22/2023 1001   K 3.8 11/07/2023 1058   CL 99 11/07/2023 1058   CO2 24 11/07/2023 1058   BUN 25 (H) 11/07/2023 1058   BUN 31 (H) 06/22/2023 1001   CREATININE 1.26 (H) 11/07/2023 1058      Component Value Date/Time   CALCIUM 9.4 11/07/2023 1058   ALKPHOS 84 11/07/2023 1058   AST 48 (H) 11/07/2023 1058   ALT 31 11/07/2023 1058   BILITOT 0.9 11/07/2023 1058   BILITOT 0.5 06/22/2023 1001      (H): Data is abnormally high  Latest Reference Range &  Units 04/28/15 11:07 07/06/23 08:42  Hep A Ab, IgM NON REACTIVE   NON REACTIVE  Hepatitis B Surface Ag NON REACTIVE  Negative NON REACTIVE  Hep B Core Ab, IgM NON REACTIVE   NON REACTIVE  Hep C Virus Ab 0.0 - 0.9 s/co ratio 0.2   HCV Ab NON REACTIVE   NON REACTIVE  HIV Screen 4th Generation wRfx Non Reactive   Non Reactive    Latest Reference Range & Units 11/07/23 10:58 11/07/23 10:59  Folate >5.9 ng/mL 31.1   Intrinsic Factor 0.0 - 1.1 AU/mL 1.1   Methylmalonic Acid, Quantitative 0 - 378 nmol/L 204   Vitamin B12 180 - 914 pg/mL  695   Radiology:  EXAM: ABDOMEN ULTRASOUND COMPLETE   COMPARISON:  Ultrasound abdomen 09/24/2022   FINDINGS: Gallbladder: Surgically absent   Common bile duct: Diameter: 4.7 mm   Liver: Coarsened echogenicity and nodular contour. No focal lesion. Portal vein is patent on color Doppler imaging with normal direction of blood flow towards the liver.   IVC: No abnormality visualized.   Pancreas: Visualized portion unremarkable.   Spleen: Enlarged measuring 17.6 cm   Right Kidney: Length: 11.2 cm. Echogenicity within normal limits. No mass or hydronephrosis visualized.   Left Kidney: Length: 12.5 cm. Echogenicity within normal limits. No mass or hydronephrosis visualized.   Abdominal aorta: No aneurysm visualized.   Other findings: None.   IMPRESSION: 1. Cirrhotic morphology of the liver. No focal lesion. 2. Splenomegaly.

## 2023-11-14 NOTE — Progress Notes (Signed)
 B12 injection  given per orders. Patient tolerated it well without problems. Vitals stable and discharged home from clinic ambulatory. Follow up as scheduled.

## 2023-11-14 NOTE — Patient Instructions (Signed)
 VISIT SUMMARY:  During today's visit, we discussed your ongoing health concerns, including liver cirrhosis, lupus, psoriatic arthritis, and your recent experiences with fatigue and weight gain. We reviewed your current treatments and made plans for continued monitoring and management of your conditions.  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 We will continue to monitor your platelet levels and repeat your lab tests in three months.  -LIVER CIRRHOSIS WITH SPLENOMEGALY: Liver cirrhosis is a condition where your liver is scarred and not functioning properly, which has also caused your spleen to enlarge. We will keep an eye on your liver function and spleen size, with repeat lab tests in three months.  -VITAMIN B12 DEFICIENCY: Vitamin B12 deficiency can cause anemia and fatigue. Your lab results have improved with the vitamin B12 injections. You will receive an injection today and another one next month, and we will monitor your response to the treatment.  -WEIGHT MANAGEMENT: Despite taking Ozempic and maintaining a healthy lifestyle, you have experienced difficulty losing weight and some weight gain. We will refer you to a nutritionist for dietary counseling to help manage your weight.  INSTRUCTIONS:  Please schedule a follow-up appointment in three months. At that time, we will repeat your complete blood count, liver function tests, and iron test to reassess your condition.

## 2023-11-14 NOTE — Assessment & Plan Note (Signed)
 Likely secondary to liver cirrhosis, contributing to thrombocytopenia.  Recent ultrasound showed a size of 17.5 cm -No changes to current management.

## 2023-11-14 NOTE — Assessment & Plan Note (Signed)
 Patient had severe vitamin B12 deficiency, improving with supplementation.  Completed 4 weekly injections so far and is getting monthly injections -Continue oral vitamin B12 -Continue parenteral B12 injections  Return to clinic in 3 months with labs

## 2023-11-14 NOTE — Assessment & Plan Note (Signed)
 Likely secondary to liver cirrhosis and plaquenil use. Stable. No signs of infection or B symptoms. -Stable now -Continue to monitor

## 2023-12-01 ENCOUNTER — Encounter: Payer: Self-pay | Admitting: Gastroenterology

## 2023-12-15 ENCOUNTER — Inpatient Hospital Stay: Attending: Hematology

## 2023-12-15 DIAGNOSIS — E538 Deficiency of other specified B group vitamins: Secondary | ICD-10-CM | POA: Diagnosis present

## 2023-12-15 MED ORDER — CYANOCOBALAMIN 1000 MCG/ML IJ SOLN
1000.0000 ug | Freq: Once | INTRAMUSCULAR | Status: AC
  Administered 2023-12-15: 1000 ug via INTRAMUSCULAR
  Filled 2023-12-15: qty 1

## 2023-12-15 NOTE — Progress Notes (Signed)
 Patient tolerated injection with no complaints voiced.  Site clean and dry with no bruising or swelling noted at site.  See MAR for details.  Band aid applied.  Patient stable during and after injection.  Vss with discharge and left in satisfactory condition with no s/s of distress noted.

## 2023-12-15 NOTE — Patient Instructions (Signed)

## 2024-01-13 ENCOUNTER — Inpatient Hospital Stay: Attending: Hematology

## 2024-01-13 VITALS — BP 109/58 | HR 80 | Temp 97.7°F | Resp 18

## 2024-01-13 DIAGNOSIS — E538 Deficiency of other specified B group vitamins: Secondary | ICD-10-CM | POA: Diagnosis present

## 2024-01-13 MED ORDER — CYANOCOBALAMIN 1000 MCG/ML IJ SOLN
1000.0000 ug | Freq: Once | INTRAMUSCULAR | Status: AC
  Administered 2024-01-13: 1000 ug via INTRAMUSCULAR
  Filled 2024-01-13: qty 1

## 2024-01-13 NOTE — Patient Instructions (Signed)
 [facility] [place] - [facility]  Discharge Instructions: Thank you for choosing [facility] to provide your oncology and hematology care.  If you have a lab appointment with the Cancer Center - please note that after April 8th, 2024, all labs will be drawn in the cancer center.  You do not have to check in or register with the main entrance as you have in the past but will complete your check-in in the cancer center.  Wear comfortable clothing and clothing appropriate for easy access to any Portacath or PICC line.   We strive to give you quality time with your provider. You may need to reschedule your appointment if you arrive late (15 or more minutes).  Arriving late affects you and other patients whose appointments are after yours.  Also, if you miss three or more appointments without notifying the office, you may be dismissed from the clinic at the provider's discretion.      For prescription refill requests, have your pharmacy contact our office and allow 72 hours for refills to be completed.    Today you received the following injection: B12   To help prevent nausea and vomiting after your treatment, we encourage you to take your nausea medication as directed.  BELOW ARE SYMPTOMS THAT SHOULD BE REPORTED IMMEDIATELY: *FEVER GREATER THAN 100.4 F (38 C) OR HIGHER *CHILLS OR SWEATING *NAUSEA AND VOMITING THAT IS NOT CONTROLLED WITH YOUR NAUSEA MEDICATION *UNUSUAL SHORTNESS OF BREATH *UNUSUAL BRUISING OR BLEEDING *URINARY PROBLEMS (pain or burning when urinating, or frequent urination) *BOWEL PROBLEMS (unusual diarrhea, constipation, pain near the anus) TENDERNESS IN MOUTH AND THROAT WITH OR WITHOUT PRESENCE OF ULCERS (sore throat, sores in mouth, or a toothache) UNUSUAL RASH, SWELLING OR PAIN  UNUSUAL VAGINAL DISCHARGE OR ITCHING   Items with * indicate a potential emergency and should be followed up as soon as possible or go to the  Emergency Department if any problems should occur.  Please show the CHEMOTHERAPY ALERT CARD or IMMUNOTHERAPY ALERT CARD at check-in to the Emergency Department and triage nurse.  Should you have questions after your visit or need to cancel or reschedule your appointment, please contact [facility] [place] - A DEPT OF [facility] [phone]  and follow the prompts.  Office hours are 8:00 a.m. to 4:30 p.m. Monday - Friday. Please note that voicemails left after 4:00 p.m. may not be returned until the following business day.  We are closed weekends and major holidays. You have access to a nurse at all times for urgent questions. Please call the main number to the clinic [phone] and follow the prompts.  For any non-urgent questions, you may also contact your provider using MyChart. We now offer e-Visits for anyone 73 and older to request care online for non-urgent symptoms. For details visit [email or web address].   Also download the MyChart app! Go to the app store, search "MyChart", open the app, select [place], and log in with your MyChart username and password.

## 2024-01-13 NOTE — Progress Notes (Signed)
 B12 injection  given per orders. Patient tolerated it well without problems. Vitals stable and discharged home from clinic ambulatory. Follow up as scheduled.

## 2024-01-30 ENCOUNTER — Encounter: Payer: Self-pay | Admitting: Oncology

## 2024-02-06 ENCOUNTER — Inpatient Hospital Stay: Attending: Hematology

## 2024-02-06 DIAGNOSIS — Z79899 Other long term (current) drug therapy: Secondary | ICD-10-CM | POA: Diagnosis not present

## 2024-02-06 DIAGNOSIS — R161 Splenomegaly, not elsewhere classified: Secondary | ICD-10-CM | POA: Insufficient documentation

## 2024-02-06 DIAGNOSIS — D696 Thrombocytopenia, unspecified: Secondary | ICD-10-CM | POA: Insufficient documentation

## 2024-02-06 DIAGNOSIS — D538 Other specified nutritional anemias: Secondary | ICD-10-CM | POA: Insufficient documentation

## 2024-02-06 DIAGNOSIS — K7460 Unspecified cirrhosis of liver: Secondary | ICD-10-CM | POA: Insufficient documentation

## 2024-02-06 DIAGNOSIS — E538 Deficiency of other specified B group vitamins: Secondary | ICD-10-CM | POA: Diagnosis present

## 2024-02-06 LAB — CBC WITH DIFFERENTIAL/PLATELET
Abs Immature Granulocytes: 0.01 10*3/uL (ref 0.00–0.07)
Basophils Absolute: 0 10*3/uL (ref 0.0–0.1)
Basophils Relative: 1 %
Eosinophils Absolute: 0.1 10*3/uL (ref 0.0–0.5)
Eosinophils Relative: 2 %
HCT: 39.1 % (ref 36.0–46.0)
Hemoglobin: 13.4 g/dL (ref 12.0–15.0)
Immature Granulocytes: 0 %
Lymphocytes Relative: 27 %
Lymphs Abs: 1 10*3/uL (ref 0.7–4.0)
MCH: 33.2 pg (ref 26.0–34.0)
MCHC: 34.3 g/dL (ref 30.0–36.0)
MCV: 96.8 fL (ref 80.0–100.0)
Monocytes Absolute: 0.4 10*3/uL (ref 0.1–1.0)
Monocytes Relative: 11 %
Neutro Abs: 2.2 10*3/uL (ref 1.7–7.7)
Neutrophils Relative %: 59 %
Platelets: 83 10*3/uL — ABNORMAL LOW (ref 150–400)
RBC: 4.04 MIL/uL (ref 3.87–5.11)
RDW: 13 % (ref 11.5–15.5)
WBC: 3.8 10*3/uL — ABNORMAL LOW (ref 4.0–10.5)
nRBC: 0 % (ref 0.0–0.2)

## 2024-02-06 LAB — VITAMIN B12: Vitamin B-12: 569 pg/mL (ref 180–914)

## 2024-02-06 LAB — COMPREHENSIVE METABOLIC PANEL WITH GFR
ALT: 27 U/L (ref 0–44)
AST: 49 U/L — ABNORMAL HIGH (ref 15–41)
Albumin: 3.6 g/dL (ref 3.5–5.0)
Alkaline Phosphatase: 110 U/L (ref 38–126)
Anion gap: 8 (ref 5–15)
BUN: 22 mg/dL — ABNORMAL HIGH (ref 6–20)
CO2: 25 mmol/L (ref 22–32)
Calcium: 9 mg/dL (ref 8.9–10.3)
Chloride: 104 mmol/L (ref 98–111)
Creatinine, Ser: 1.24 mg/dL — ABNORMAL HIGH (ref 0.44–1.00)
GFR, Estimated: 51 mL/min — ABNORMAL LOW (ref >60)
Glucose, Bld: 237 mg/dL — ABNORMAL HIGH (ref 70–99)
Potassium: 3.8 mmol/L (ref 3.5–5.1)
Sodium: 137 mmol/L (ref 135–145)
Total Bilirubin: 0.9 mg/dL (ref 0.0–1.2)
Total Protein: 7.4 g/dL (ref 6.5–8.1)

## 2024-02-06 LAB — IRON AND TIBC
Iron: 75 ug/dL (ref 28–170)
Saturation Ratios: 17 % (ref 10.4–31.8)
TIBC: 452 ug/dL — ABNORMAL HIGH (ref 250–450)
UIBC: 377 ug/dL

## 2024-02-06 LAB — FERRITIN: Ferritin: 31 ng/mL (ref 11–307)

## 2024-02-06 LAB — FOLATE: Folate: 19.8 ng/mL (ref >5.9)

## 2024-02-13 ENCOUNTER — Inpatient Hospital Stay

## 2024-02-13 ENCOUNTER — Inpatient Hospital Stay: Admitting: Oncology

## 2024-02-24 ENCOUNTER — Inpatient Hospital Stay: Admitting: Oncology

## 2024-02-24 ENCOUNTER — Inpatient Hospital Stay

## 2024-02-24 VITALS — BP 97/46 | HR 61 | Temp 97.9°F | Resp 18 | Wt 199.3 lb

## 2024-02-24 DIAGNOSIS — R161 Splenomegaly, not elsewhere classified: Secondary | ICD-10-CM

## 2024-02-24 DIAGNOSIS — D696 Thrombocytopenia, unspecified: Secondary | ICD-10-CM | POA: Diagnosis not present

## 2024-02-24 DIAGNOSIS — K7460 Unspecified cirrhosis of liver: Secondary | ICD-10-CM

## 2024-02-24 DIAGNOSIS — E538 Deficiency of other specified B group vitamins: Secondary | ICD-10-CM

## 2024-02-24 DIAGNOSIS — D539 Nutritional anemia, unspecified: Secondary | ICD-10-CM | POA: Insufficient documentation

## 2024-02-24 DIAGNOSIS — E611 Iron deficiency: Secondary | ICD-10-CM | POA: Insufficient documentation

## 2024-02-24 DIAGNOSIS — D72819 Decreased white blood cell count, unspecified: Secondary | ICD-10-CM

## 2024-02-24 MED ORDER — CYANOCOBALAMIN 1000 MCG/ML IJ SOLN: 1000.0000 ug | Freq: Once | INTRAMUSCULAR | Status: DC

## 2024-02-24 MED ORDER — FERROUS SULFATE 325 (65 FE) MG PO TBEC: 325.0000 mg | DELAYED_RELEASE_TABLET | ORAL | 3 refills | Status: AC

## 2024-02-24 NOTE — Assessment & Plan Note (Signed)
 Patient has mild iron deficiency with TSAT less than 20 and ferritin less than 50 with no anemia  - Start taking oral ferrous sulfate every other day.  Use MiraLAX for constipation.

## 2024-02-24 NOTE — Assessment & Plan Note (Signed)
 Likely secondary to liver cirrhosis and plaquenil use. Stable. No signs of infection or B symptoms. -Stable now -Continue to monitor

## 2024-02-24 NOTE — Assessment & Plan Note (Signed)
 Likely secondary to liver cirrhosis, contributing to thrombocytopenia.  Recent ultrasound showed a size of 17.5 cm -No changes to current management.

## 2024-02-24 NOTE — Assessment & Plan Note (Signed)
 Likely secondary to B12 deficiency  Resolved at this time

## 2024-02-24 NOTE — Patient Instructions (Signed)
 VISIT SUMMARY:  You had a follow-up visit to discuss your liver cirrhosis, anemia, and kidney function. Your liver cirrhosis remains stable, and your kidney function has improved. We also reviewed your history of anemia and discussed iron supplementation due to borderline low iron levels.  YOUR PLAN:  -LIVER CIRRHOSIS: Liver cirrhosis is a condition where the liver is scarred and permanently damaged. Your liver cirrhosis remains stable, and there are no new issues with bruising or bleeding. Continue monitoring your condition and maintain a healthy diet.  -CHRONIC KIDNEY DISEASE: Chronic kidney disease is a long-term condition where the kidneys do not work as well as they should. Your kidney function has improved. Due to low iron levels associated with kidney disease, you should start taking iron supplements every other day. A prescription for iron supplementation has been sent to [name]. Take the iron with orange juice for better absorption and avoid taking it with coffee.   INSTRUCTIONS:  Please follow up with Dr. Carrolyn Clan for your kidney issues as scheduled. Continue with your current diet and monitor for any new symptoms. If you have any questions or concerns, do not hesitate to contact our office.

## 2024-02-24 NOTE — Assessment & Plan Note (Signed)
 Patient had severe vitamin B12 deficiency, improving with supplementation.   Completed parenteral supplementation  -Continue oral vitamin B12  Return to clinic in 3 months with labs

## 2024-02-24 NOTE — Progress Notes (Signed)
 [facility]  HEMATOLOGY FOLLOW-UP VISIT  Chelsea Bickers, MD  REASON FOR FOLLOW-UP: Thrombocytopenia  ASSESSMENT & PLAN:  Patient is a 58 year old female with liver cirrhosis, vitamin B12 deficiency and splenomegaly following for thrombocytopenia   Thrombocytopenia (HCC) Likely secondary to liver cirrhosis and splenomegaly.  Also being contributed by vitamin B12 deficiency.   Hepatitis panel: Negative No active signs of bleeding or ecchymosis.  -Stable currently -Continue to monitor  Vitamin B12 deficiency Patient had severe vitamin B12 deficiency, improving with supplementation.   Completed parenteral supplementation  -Continue oral vitamin B12  Return to clinic in 3 months with labs  Hepatic cirrhosis Chronic, contributing to thrombocytopenia and splenomegaly.  -No changes to current management. -Follow up with GI  Splenomegaly Likely secondary to liver cirrhosis, contributing to thrombocytopenia.   Recent ultrasound showed a size of 17.5 cm  -No changes to current management.   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 Stable.  No signs of infection or B symptoms.  -Stable now -Continue to monitor  Macrocytic anemia Likely secondary to B12 deficiency  Resolved at this time  Iron deficiency Patient has mild iron deficiency with TSAT less than 20 and ferritin less than 50 with no anemia  - Start taking oral ferrous sulfate every other day.  Use MiraLAX for constipation.   Orders Placed This Encounter  Procedures   Ferritin    Standing Status:   Future    Expected Date:   08/20/2024    Expiration Date:   11/18/2024   Folate    Standing Status:   Future    Expected Date:   08/20/2024    Expiration Date:   11/18/2024   Vitamin B12    Standing Status:   Future    Expected Date:   08/20/2024    Expiration Date:   11/18/2024   CBC with Differential/Platelet    Standing Status:   Future     Expected Date:   08/20/2024    Expiration Date:   11/18/2024   Comprehensive metabolic panel with GFR    Standing Status:   Future    Expected Date:   08/20/2024    Expiration Date:   11/18/2024   Iron and TIBC    Standing Status:   Future    Expected Date:   08/20/2024    Expiration Date:   11/18/2024    The total time spent in the appointment was 20 minutes encounter with patients including review of chart and various tests results, discussions about plan of care and coordination of care plan   All questions were answered. The patient knows to call the clinic with any problems, questions or concerns. No barriers to learning was detected.  Chelsea Grade, MD 6/20/202510:21 AM    INTERVAL HISTORY: Chelsea Estrada 58 y.o. female is here for follow-up for thrombocytopenia.  She has a history of liver cirrhosis, lupus, psoriatic arthritis, and osteo arthritis. She denies fever, chills, nausea, vomiting, loss of weight, abdominal pain.  Reports feeling better.  Overall, she is doing well and has no other complaints.   I have reviewed the past medical history, past surgical history, social history and family history with the patient   ALLERGIES:  is allergic to prednisone, clindamycin/lincomycin, penicillamine, penicillins, doxycycline, and sulfamethoxazole -trimethoprim .  MEDICATIONS:  Current Outpatient Medications  Medication Sig Dispense Refill   acetaminophen  (TYLENOL ) 500 MG tablet Take 1,000 mg by mouth every 6 (six) hours as needed for moderate pain.  ALPRAZolam (XANAX) 0.5 MG tablet Take 0.5 mg by mouth daily.     atorvastatin (LIPITOR) 10 MG tablet Take 10 mg by mouth daily.      augmented betamethasone dipropionate (DIPROLENE-AF) 0.05 % cream Apply 1 application topically See admin instructions. Apply once daily, may apply up to 4 times a day as needed for psoriasis     Cyanocobalamin  (VITAMIN B-12) 1000 MCG SUBL TAKE ONE (1) TABLET BY MOUTH EVERY DAY 90 tablet 3    dexamethasone  (DECADRON ) 0.5 MG/5ML solution      diphenhydrAMINE (BENADRYL) 25 MG tablet Take 25 mg by mouth daily as needed for allergies.     fenofibrate 160 MG tablet Take 160 mg by mouth at bedtime.     ferrous sulfate 325 (65 FE) MG EC tablet Take 1 tablet (325 mg total) by mouth every other day. 45 tablet 3   furosemide (LASIX) 20 MG tablet Take 20 mg by mouth every other day. If gaining in between take two     HYDROcodone-acetaminophen  (NORCO/VICODIN) 5-325 MG tablet Take 1 tablet by mouth every 6 (six) hours as needed for moderate pain. For lupus     hydroxychloroquine (PLAQUENIL) 200 MG tablet Take 200 mg by mouth 2 (two) times daily.      Krill Oil 500 MG CAPS Take 500 mg by mouth in the morning and at bedtime.     Lancets (ONETOUCH DELICA PLUS LANCET33G) MISC Apply topically.     levothyroxine (SYNTHROID, LEVOTHROID) 25 MCG tablet Take 25 mcg by mouth daily before breakfast.     magnesium oxide (MAG-OX) 400 MG tablet Take 400 mg by mouth daily.     metoprolol succinate (TOPROL-XL) 50 MG 24 hr tablet Take 50 mg by mouth every morning. Take with or immediately following a meal.     Milk Thistle 1000 MG CAPS Take 1,000 mg by mouth daily.      MOUNJARO 7.5 MG/0.5ML Pen SMARTSIG:7.5 Milligram(s) SUB-Q Once a Week     ONETOUCH ULTRA TEST test strip      pantoprazole (PROTONIX) 20 MG tablet Take 20 mg by mouth daily.     Polyethyl Glycol-Propyl Glycol (SYSTANE OP) Place 1 drop into both eyes daily as needed (dry eyes).     Probiotic Product (PROBIOTIC PO) Take 1 capsule by mouth daily.     tiZANidine (ZANAFLEX) 4 MG tablet Take 4 mg by mouth at bedtime.     TREMFYA 100 MG/ML SOPN Inject 1 Dose into the skin every 8 (eight) weeks.     desvenlafaxine (PRISTIQ) 50 MG 24 hr tablet Take 50 mg by mouth daily.     No current facility-administered medications for this visit.   Facility-Administered Medications Ordered in Other Visits  Medication Dose Route Frequency Provider Last Rate Last  Admin   cyanocobalamin  (VITAMIN B12) injection 1,000 mcg  1,000 mcg Intramuscular Once Birt Reinoso, MD         REVIEW OF SYSTEMS:   Constitutional: Denies fevers, chills or night sweats Eyes: Denies blurriness of vision Ears, nose, mouth, throat, and face: Denies mucositis or sore throat Respiratory: Denies cough, dyspnea or wheezes Cardiovascular: Denies palpitation, chest discomfort or lower extremity swelling Gastrointestinal:  Denies nausea, heartburn or change in bowel habits Skin: Denies abnormal skin rashes Lymphatics: Denies new lymphadenopathy or easy bruising Neurological:Denies numbness, tingling or new weaknesses Behavioral/Psych: Mood is stable, no new changes  All other systems were reviewed with the patient and are negative.  PHYSICAL EXAMINATION:   Vitals:   02/24/24  0915  BP: (!) 97/46  Pulse: 61  Resp: 18  Temp: 97.9 F (36.6 C)  SpO2: 98%     GENERAL:alert, no distress and comfortable. Hoarse voice LUNGS: clear to auscultation and percussion with normal breathing effort HEART: regular rate & rhythm and no murmurs and no lower extremity edema ABDOMEN:abdomen soft, non-tender and normal bowel sounds Musculoskeletal:no cyanosis of digits and no clubbing  NEURO: alert & oriented x 3 with fluent speech  LABORATORY DATA:  I have reviewed the data as listed  Lab Results  Component Value Date   WBC 3.8 (L) 02/06/2024   NEUTROABS 2.2 02/06/2024   HGB 13.4 02/06/2024   HCT 39.1 02/06/2024   MCV 96.8 02/06/2024   PLT 83 (L) 02/06/2024       Chemistry      Component Value Date/Time   NA 137 02/06/2024 1039   NA 143 06/22/2023 1001   K 3.8 02/06/2024 1039   CL 104 02/06/2024 1039   CO2 25 02/06/2024 1039   BUN 22 (H) 02/06/2024 1039   BUN 31 (H) 06/22/2023 1001   CREATININE 1.24 (H) 02/06/2024 1039      Component Value Date/Time   CALCIUM 9.0 02/06/2024 1039   ALKPHOS 110 02/06/2024 1039   AST 49 (H) 02/06/2024 1039   ALT 27 02/06/2024  1039   BILITOT 0.9 02/06/2024 1039   BILITOT 0.5 06/22/2023 1001      (H): Data is abnormally high  Latest Reference Range & Units 04/28/15 11:07 07/06/23 08:42  Hep A Ab, IgM NON REACTIVE   NON REACTIVE  Hepatitis B Surface Ag NON REACTIVE  Negative NON REACTIVE  Hep B Core Ab, IgM NON REACTIVE   NON REACTIVE  Hep C Virus Ab 0.0 - 0.9 s/co ratio 0.2   HCV Ab NON REACTIVE   NON REACTIVE  HIV Screen 4th Generation wRfx Non Reactive   Non Reactive    Latest Reference Range & Units 11/07/23 10:58 11/07/23 10:59  Folate >5.9 ng/mL 31.1   Intrinsic Factor 0.0 - 1.1 AU/mL 1.1   Methylmalonic Acid, Quantitative 0 - 378 nmol/L 204     Latest Reference Range & Units 02/06/24 10:39  Iron 28 - 170 ug/dL 75  UIBC ug/dL 147  TIBC 829 - 562 ug/dL 130 (H)  Saturation Ratios 10.4 - 31.8 % 17  Ferritin 11 - 307 ng/mL 31  Folate >5.9 ng/mL 19.8  Vitamin B12 180 - 914 pg/mL 569  (H): Data is abnormally high Radiology:  EXAM: ABDOMEN ULTRASOUND COMPLETE   COMPARISON:  Ultrasound abdomen 09/24/2022   FINDINGS: Gallbladder: Surgically absent   Common bile duct: Diameter: 4.7 mm   Liver: Coarsened echogenicity and nodular contour. No focal lesion. Portal vein is patent on color Doppler imaging with normal direction of blood flow towards the liver.   IVC: No abnormality visualized.   Pancreas: Visualized portion unremarkable.   Spleen: Enlarged measuring 17.6 cm   Right Kidney: Length: 11.2 cm. Echogenicity within normal limits. No mass or hydronephrosis visualized.   Left Kidney: Length: 12.5 cm. Echogenicity within normal limits. No mass or hydronephrosis visualized.   Abdominal aorta: No aneurysm visualized.   Other findings: None.   IMPRESSION: 1. Cirrhotic morphology of the liver. No focal lesion. 2. Splenomegaly.

## 2024-02-24 NOTE — Progress Notes (Signed)
 Per Dr. Orvis Blare patient will no longer receive B12 injections.

## 2024-02-24 NOTE — Assessment & Plan Note (Signed)
 Chronic, contributing to thrombocytopenia and splenomegaly. -No changes to current management. -Follow up with GI

## 2024-02-24 NOTE — Assessment & Plan Note (Addendum)
 Likely secondary to liver cirrhosis and splenomegaly.  Also being contributed by vitamin B12 deficiency.   Hepatitis panel: Negative No active signs of bleeding or ecchymosis.  -Stable currently -Continue to monitor

## 2024-03-15 ENCOUNTER — Encounter (INDEPENDENT_AMBULATORY_CARE_PROVIDER_SITE_OTHER): Payer: Self-pay | Admitting: *Deleted

## 2024-04-20 ENCOUNTER — Other Ambulatory Visit (HOSPITAL_COMMUNITY): Payer: Self-pay | Admitting: Nurse Practitioner

## 2024-04-20 DIAGNOSIS — F17200 Nicotine dependence, unspecified, uncomplicated: Secondary | ICD-10-CM

## 2024-04-20 DIAGNOSIS — Z122 Encounter for screening for malignant neoplasm of respiratory organs: Secondary | ICD-10-CM

## 2024-05-23 ENCOUNTER — Ambulatory Visit (HOSPITAL_COMMUNITY): Admission: RE | Admit: 2024-05-23 | Source: Ambulatory Visit

## 2024-05-23 ENCOUNTER — Encounter (HOSPITAL_COMMUNITY): Payer: Self-pay

## 2024-06-29 ENCOUNTER — Ambulatory Visit (HOSPITAL_COMMUNITY)
Admission: RE | Admit: 2024-06-29 | Discharge: 2024-06-29 | Disposition: A | Discharge status: Home / Self Care | Source: Ambulatory Visit | Attending: Nurse Practitioner | Admitting: Nurse Practitioner

## 2024-06-29 DIAGNOSIS — R161 Splenomegaly, not elsewhere classified: Secondary | ICD-10-CM | POA: Diagnosis not present

## 2024-06-29 DIAGNOSIS — Z122 Encounter for screening for malignant neoplasm of respiratory organs: Secondary | ICD-10-CM | POA: Diagnosis present

## 2024-06-29 DIAGNOSIS — F17200 Nicotine dependence, unspecified, uncomplicated: Secondary | ICD-10-CM

## 2024-06-29 DIAGNOSIS — J439 Emphysema, unspecified: Secondary | ICD-10-CM | POA: Diagnosis not present

## 2024-06-29 DIAGNOSIS — Z87891 Personal history of nicotine dependence: Secondary | ICD-10-CM | POA: Insufficient documentation

## 2024-08-24 ENCOUNTER — Inpatient Hospital Stay: Attending: Physician Assistant

## 2024-08-24 DIAGNOSIS — E538 Deficiency of other specified B group vitamins: Secondary | ICD-10-CM | POA: Diagnosis present

## 2024-08-24 DIAGNOSIS — D696 Thrombocytopenia, unspecified: Secondary | ICD-10-CM | POA: Insufficient documentation

## 2024-08-24 DIAGNOSIS — D539 Nutritional anemia, unspecified: Secondary | ICD-10-CM

## 2024-08-24 DIAGNOSIS — D72819 Decreased white blood cell count, unspecified: Secondary | ICD-10-CM

## 2024-08-24 LAB — CBC WITH DIFFERENTIAL/PLATELET
Abs Immature Granulocytes: 0.01 K/uL (ref 0.00–0.07)
Basophils Absolute: 0 K/uL (ref 0.0–0.1)
Basophils Relative: 1 %
Eosinophils Absolute: 0.1 K/uL (ref 0.0–0.5)
Eosinophils Relative: 2 %
HCT: 42.5 % (ref 36.0–46.0)
Hemoglobin: 14.2 g/dL (ref 12.0–15.0)
Immature Granulocytes: 0 %
Lymphocytes Relative: 23 %
Lymphs Abs: 1 K/uL (ref 0.7–4.0)
MCH: 32.4 pg (ref 26.0–34.0)
MCHC: 33.4 g/dL (ref 30.0–36.0)
MCV: 97 fL (ref 80.0–100.0)
Monocytes Absolute: 0.5 K/uL (ref 0.1–1.0)
Monocytes Relative: 12 %
Neutro Abs: 2.8 K/uL (ref 1.7–7.7)
Neutrophils Relative %: 62 %
Platelets: 67 K/uL — ABNORMAL LOW (ref 150–400)
RBC: 4.38 MIL/uL (ref 3.87–5.11)
RDW: 12.9 % (ref 11.5–15.5)
WBC: 4.4 K/uL (ref 4.0–10.5)
nRBC: 0 % (ref 0.0–0.2)

## 2024-08-24 LAB — COMPREHENSIVE METABOLIC PANEL WITH GFR
ALT: 32 U/L (ref 0–44)
AST: 55 U/L — ABNORMAL HIGH (ref 15–41)
Albumin: 4.1 g/dL (ref 3.5–5.0)
Alkaline Phosphatase: 192 U/L — ABNORMAL HIGH (ref 38–126)
Anion gap: 13 (ref 5–15)
BUN: 17 mg/dL (ref 6–20)
CO2: 26 mmol/L (ref 22–32)
Calcium: 9.8 mg/dL (ref 8.9–10.3)
Chloride: 102 mmol/L (ref 98–111)
Creatinine, Ser: 1.12 mg/dL — ABNORMAL HIGH (ref 0.44–1.00)
GFR, Estimated: 57 mL/min — ABNORMAL LOW
Glucose, Bld: 111 mg/dL — ABNORMAL HIGH (ref 70–99)
Potassium: 4 mmol/L (ref 3.5–5.1)
Sodium: 141 mmol/L (ref 135–145)
Total Bilirubin: 0.8 mg/dL (ref 0.0–1.2)
Total Protein: 7.8 g/dL (ref 6.5–8.1)

## 2024-08-24 LAB — IRON AND TIBC
Iron: 90 ug/dL (ref 28–170)
Saturation Ratios: 28 % (ref 10.4–31.8)
TIBC: 318 ug/dL (ref 250–450)
UIBC: 228 ug/dL

## 2024-08-24 LAB — FOLATE: Folate: 17.3 ng/mL

## 2024-08-24 LAB — VITAMIN B12: Vitamin B-12: 906 pg/mL (ref 180–914)

## 2024-08-24 LAB — FERRITIN: Ferritin: 84 ng/mL (ref 11–307)

## 2024-08-31 ENCOUNTER — Inpatient Hospital Stay: Admitting: Oncology

## 2024-09-03 ENCOUNTER — Encounter: Payer: Self-pay | Admitting: *Deleted

## 2024-09-04 ENCOUNTER — Inpatient Hospital Stay: Admitting: Oncology

## 2024-09-05 ENCOUNTER — Encounter: Payer: Self-pay | Admitting: Oncology

## 2024-09-05 ENCOUNTER — Telehealth (INDEPENDENT_AMBULATORY_CARE_PROVIDER_SITE_OTHER): Payer: Self-pay

## 2024-09-05 ENCOUNTER — Ambulatory Visit (INDEPENDENT_AMBULATORY_CARE_PROVIDER_SITE_OTHER): Admitting: Gastroenterology

## 2024-09-05 ENCOUNTER — Encounter: Payer: Self-pay | Admitting: Gastroenterology

## 2024-09-05 VITALS — BP 104/69 | HR 84 | Temp 97.4°F | Ht 63.0 in | Wt 185.6 lb

## 2024-09-05 DIAGNOSIS — K219 Gastro-esophageal reflux disease without esophagitis: Secondary | ICD-10-CM | POA: Diagnosis not present

## 2024-09-05 DIAGNOSIS — R748 Abnormal levels of other serum enzymes: Secondary | ICD-10-CM | POA: Diagnosis not present

## 2024-09-05 DIAGNOSIS — K52832 Lymphocytic colitis: Secondary | ICD-10-CM | POA: Diagnosis not present

## 2024-09-05 DIAGNOSIS — K7460 Unspecified cirrhosis of liver: Secondary | ICD-10-CM | POA: Diagnosis not present

## 2024-09-05 DIAGNOSIS — K7469 Other cirrhosis of liver: Secondary | ICD-10-CM

## 2024-09-05 NOTE — Patient Instructions (Signed)
 Please have blood work completed when you see your primary care doctor. I have ordered this for you to take when you see them.  We are arranging a routine ultrasound!  We will see you back in 6 months and can arrange the endoscopy then.  I enjoyed talking with you and pray you have a wonderful new year!  I enjoyed seeing you again today! I value our relationship and want to provide genuine, compassionate, and quality care. You may receive a survey regarding your visit with me, and I welcome your feedback! Thanks so much for taking the time to complete this. I look forward to seeing you again.      Therisa MICAEL Stager, PhD, ANP-BC [facility]

## 2024-09-05 NOTE — Telephone Encounter (Signed)
 Spoke with patient in the office, scheduled ultrasound appointment for 09/18/2024 at 9:30. Patient aware and verbalized understanding.

## 2024-09-05 NOTE — Progress Notes (Signed)
 "   Gastroenterology Office Note     Primary Care Physician:  Shona Norleen PEDLAR, MD  Primary Gastroenterologist: Dr Cindie   Chief Complaint   Chief Complaint  Patient presents with   Follow-up    Pt arrives for follow up. Pt states she is better than she has been in a long time. Still has diarrhea but is manageable for the most part.      History of Present Illness   Chelsea Estrada is a delightful 58 y.o. female presenting today with a history of cirrhosis due to [facility], diarrhea, colonoscopy Aug 2022 with chronic lymphocytic colitis. 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 Surveillance due in 2025.  She was last seen about a year ago.  Historically, she has desired to only have yearly ultrasounds.  She has had hep A and B vaccinations.  She is due for an endoscopy but she would like to hold off on this until the summertime when she might have ability to have transportation.  Right now she does not have anyone that could take her.  No mental status changes, confusion, pruritus.  She has no abdominal pain, no nausea or vomiting.  Lost voice for 2 months. Initially thought was sinuses. Realized it was reflux related. Started on pantoprazole 20 mg by PCP. Much improved and resolved now.    Alk Phos recently 192, AST 55, not fasting this time.   Occasionally has fecal urgency but managing and at her baseline.   Colonoscopy Aug 2022: internal hemorrhoids, diverticulosis, lymphocytic colitis.   EGD Aug 2022: Grade 1 varices, portal gastropathy, gastritis, surveillance 3 years, negative H.pylori.   Past Medical History:  Diagnosis Date   Family history of adverse reaction to anesthesia    mother-- PONV   Frequency of urination    History of kidney stones    Hypertension    Hypothyroidism    Lupus    effects joints--  rheumologist-  dr mai w/ [facility]   Right ureteral stone    Type 2 diabetes mellitus (HCC)    Urgency of urination    Wears  glasses     Past Surgical History:  Procedure Laterality Date   BIOPSY  02/17/2018   Procedure: BIOPSY;  Surgeon: Harvey Margo CROME, MD;  [facility];  Service: Endoscopy;;  duodenum;   BIOPSY  04/21/2021   Procedure: BIOPSY;  Surgeon: Cindie Carlin POUR, DO;  Location: [facility];  Service: Endoscopy;;  gastric colon   CHOLECYSTECTOMY OPEN  age 89's   COLONOSCOPY N/A 04/10/2018   recto-sigmoid diverticulosis, torturous left colon, external/internal hemorrhoids   COLONOSCOPY WITH PROPOFOL  N/A 04/21/2021   non-bleeding internal hemorrhoid, sigmoid and descending colon diverticulosis, random colonic biopsies consistent with chronic lymphocytic colitis.   CYSTOSCOPY WITH RETROGRADE PYELOGRAM, URETEROSCOPY AND STENT PLACEMENT Right 04/04/2015   Procedure: CYSTOSCOPY WITH RETROGRADE PYELOGRAM, URETEROSCOPY AND STENT PLACEMENT;  Surgeon: Ricardo Likens, MD;  Location: [facility];  Service: Urology;  Laterality: Right;   ESOPHAGOGASTRODUODENOSCOPY N/A 08/25/2015   Grade 1 varices, patent Schatzki's ring, moderate erosive gastritis.   ESOPHAGOGASTRODUODENOSCOPY N/A 02/17/2018   non-obstructing Schatzki's ring, non-bleeding diverticulum in second portion of duodenum, erosive gastritis   ESOPHAGOGASTRODUODENOSCOPY (EGD) WITH PROPOFOL  N/A 04/21/2021   Grade 1 varices, portal gastropathy, gastritis. Surveillance in 3 years. Negative H.pylori.   HOLMIUM LASER APPLICATION Right 04/04/2015   Procedure: HOLMIUM LASER APPLICATION;  Surgeon: Ricardo Likens, MD;  Location: [facility];  Service: Urology;  Laterality: Right;    Current Outpatient Medications  Medication Sig Dispense Refill   acetaminophen  (TYLENOL ) 500 MG tablet Take 1,000 mg by mouth every 6 (six) hours as needed for moderate pain.     ALPRAZolam (XANAX) 0.5 MG tablet Take 0.5 mg by mouth daily. (Patient taking differently: Take 0.5 mg by mouth as needed.)     atorvastatin (LIPITOR) 10 MG tablet  Take 10 mg by mouth daily.      augmented betamethasone dipropionate (DIPROLENE-AF) 0.05 % cream Apply 1 application topically See admin instructions. Apply once daily, may apply up to 4 times a day as needed for psoriasis     Cyanocobalamin  (VITAMIN B-12) 1000 MCG SUBL TAKE ONE (1) TABLET BY MOUTH EVERY DAY 90 tablet 3   dexamethasone  (DECADRON ) 0.5 MG/5ML solution  (Patient taking differently: as needed.)     diphenhydrAMINE (BENADRYL) 25 MG tablet Take 25 mg by mouth daily as needed for allergies.     ferrous sulfate  325 (65 FE) MG EC tablet Take 1 tablet (325 mg total) by mouth every other day. 45 tablet 3   furosemide (LASIX) 20 MG tablet Take 20 mg by mouth every other day. If gaining in between take two (Patient taking differently: Take 20 mg by mouth daily. If gaining in between take two 2 q am)     HYDROcodone-acetaminophen  (NORCO/VICODIN) 5-325 MG tablet Take 1 tablet by mouth every 6 (six) hours as needed for moderate pain. For lupus     hydroxychloroquine (PLAQUENIL) 200 MG tablet Take 200 mg by mouth 2 (two) times daily.      Krill Oil 500 MG CAPS Take 500 mg by mouth in the morning and at bedtime.     Lancets (ONETOUCH DELICA PLUS LANCET33G) MISC Apply topically.     levothyroxine (SYNTHROID, LEVOTHROID) 25 MCG tablet Take 25 mcg by mouth daily before breakfast.     magnesium oxide (MAG-OX) 400 MG tablet Take 400 mg by mouth daily.     metoprolol succinate (TOPROL-XL) 50 MG 24 hr tablet Take 50 mg by mouth every morning. Take with or immediately following a meal.     Milk Thistle 1000 MG CAPS Take 1,000 mg by mouth daily.      MOUNJARO 7.5 MG/0.5ML Pen SMARTSIG:7.5 Milligram(s) SUB-Q Once a Week (Patient taking differently: 12.5 mg.)     ONETOUCH ULTRA TEST test strip      pantoprazole (PROTONIX) 20 MG tablet Take 20 mg by mouth daily.     Polyethyl Glycol-Propyl Glycol (SYSTANE OP) Place 1 drop into both eyes daily as needed (dry eyes).     Probiotic Product (PROBIOTIC PO) Take 1  capsule by mouth daily.     tiZANidine (ZANAFLEX) 4 MG tablet Take 4 mg by mouth at bedtime.     TREMFYA 100 MG/ML SOPN Inject 1 Dose into the skin every 8 (eight) weeks.     UNABLE TO FIND Med Name: Plexus for gut health     No current facility-administered medications for this visit.    Allergies as of 09/05/2024 - Review Complete 09/05/2024  Allergen Reaction Noted   Prednisone Hypertension 03/11/2022   Clindamycin/lincomycin  12/16/2022   Penicillamine  11/14/2023   Penicillins Hives 10/26/2010   Doxycycline Rash 07/18/2017   Sulfamethoxazole -trimethoprim  Other (See Comments) and Rash 09/18/2019    Family History  Problem Relation Age of Onset   Liver disease Mother    Diabetes type II Mother    Hypertension Mother    Diabetes Mellitus II Father  Hypertension Father    Heart disease Father    Colon cancer Neg Hx     Social History   Socioeconomic History   Marital status: Married    Spouse name: Not on file   Number of children: Not on file   Years of education: Not on file   Highest education level: Not on file  Occupational History   Not on file  Tobacco Use   Smoking status: Every Day    Current packs/day: 1.00    Types: Cigarettes    Passive exposure: Current   Smokeless tobacco: Never   Tobacco comments:    one pack daily  Vaping Use   Vaping status: Never Used  Substance and Sexual Activity   Alcohol use: Yes    Alcohol/week: 0.0 standard drinks of alcohol    Comment: Very rarely: typically once every 6+ months; typically 1 drink per occurrence.   Drug use: No   Sexual activity: Not on file  Other Topics Concern   Not on file  Social History Narrative   Not on file   Social Drivers of Health   Tobacco Use: High Risk (09/05/2024)   Patient History    Smoking Tobacco Use: Every Day    Smokeless Tobacco Use: Never    Passive Exposure: Current  Financial Resource Strain: Not on file  Food Insecurity: Not on file  Transportation Needs: Not  on file  Physical Activity: Not on file  Stress: Not on file  Social Connections: Not on file  Intimate Partner Violence: Not on file  Depression (EYV7-0): Not on file  Alcohol Screen: Not on file  Housing: Not on file  Utilities: Not on file  Health Literacy: Not on file     Review of Systems   Gen: Denies any fever, chills, fatigue, weight loss, lack of appetite.  CV: Denies chest pain, heart palpitations, peripheral edema, syncope.  Resp: Denies shortness of breath at rest or with exertion. Denies wheezing or cough.  GI: Denies dysphagia or odynophagia. Denies jaundice, hematemesis, fecal incontinence. GU : Denies urinary burning, urinary frequency, urinary hesitancy MS: Denies joint pain, muscle weakness, cramps, or limitation of movement.  Derm: Denies rash, itching, dry skin Psych: Denies depression, anxiety, memory loss, and confusion Heme: Denies bruising, bleeding, and enlarged lymph nodes.   Physical Exam   BP 104/69   Pulse 84   Temp (!) 97.4 F (36.3 C)   Ht 5' 3 (1.6 m)   Wt 185 lb 9.6 oz (84.2 kg)   BMI 32.88 kg/m  General:   Alert and oriented. Pleasant and cooperative. Well-nourished and well-developed.  Head:  Normocephalic and atraumatic. Eyes:  Without icterus Abdomen:  +BS, soft, non-tender and non-distended. No HSM noted. No guarding or rebound. No masses appreciated.  Rectal:  Deferred  Msk:  Symmetrical without gross deformities. Normal posture. Extremities:  Without edema. Neurologic:  Alert and  oriented x4;  grossly normal neurologically. Skin:  Intact without significant lesions or rashes. Psych:  Alert and cooperative. Normal mood and affect.   Assessment   delightful 58 y.o. female presenting today with a history of cirrhosis due to [facility], chronic lymphocytic colitis now quiescent, returning for follow-up.  Overall remains compensated from a cirrhosis standpoint.  She is desired yearly ultrasounds instead of every 6 months, so we we  will arrange her ultrasound now.  I also note that she had a slight bump in her alkaline phos recently from previously normal, but she states she was not fasting  at that time.  We will repeat this along with MELD labs and AFP to be done in the near future.  She is also due for upper endoscopy, but she would like to wait and arrange this at next visit.  GERD noted with likely LPR earlier this year in interim from last visit, and she has been started on pantoprazole 20 mg daily by her PCP and doing well.  She will continue this for now.    PLAN   Right upper quadrant ultrasound, MELD labs, AFP Recommend EGD when able to do so. We will see her back in 6 months and can arrange EGD at that time.   Therisa MICAEL Stager, PhD, ANP-BC [facility]    "

## 2024-09-17 ENCOUNTER — Inpatient Hospital Stay: Attending: Oncology | Admitting: Physician Assistant

## 2024-09-17 DIAGNOSIS — E538 Deficiency of other specified B group vitamins: Secondary | ICD-10-CM | POA: Insufficient documentation

## 2024-09-17 DIAGNOSIS — D696 Thrombocytopenia, unspecified: Secondary | ICD-10-CM | POA: Insufficient documentation

## 2024-09-17 DIAGNOSIS — K7460 Unspecified cirrhosis of liver: Secondary | ICD-10-CM | POA: Insufficient documentation

## 2024-09-17 DIAGNOSIS — R161 Splenomegaly, not elsewhere classified: Secondary | ICD-10-CM | POA: Insufficient documentation

## 2024-09-17 DIAGNOSIS — E611 Iron deficiency: Secondary | ICD-10-CM | POA: Insufficient documentation

## 2024-09-17 NOTE — Progress Notes (Unsigned)
 " [name]   PROGRESS NOTE  Patient Care Team: Chelsea Norleen PEDLAR, MD as PCP - General (Internal Medicine) Chelsea Carlin POUR, DO as Consulting Physician (Internal Medicine)   CHIEF COMPLAINTS/PURPOSE OF CONSULTATION:  Thrombocytopenia Vitamin B12 deiciency  INTERVAL HISTORY:  Chelsea Estrada 59 y.o. femalereturns for a follow up for vitamin B12 deficiency and thrombocytopenia.   On exam today ***  MEDICAL HISTORY:  Past Medical History:  Diagnosis Date   Family history of adverse reaction to anesthesia    mother-- PONV   Frequency of urination    History of kidney stones    Hypertension    Hypothyroidism    Lupus    effects joints--  rheumologist-  dr mai w/ [facility]   Right ureteral stone    Type 2 diabetes mellitus (HCC)    Urgency of urination    Wears glasses     SURGICAL HISTORY: Past Surgical History:  Procedure Laterality Date   BIOPSY  02/17/2018   Procedure: BIOPSY;  Surgeon: Chelsea Margo CROME, MD;  [facility];  Service: Endoscopy;;  duodenum;   BIOPSY  04/21/2021   Procedure: BIOPSY;  Surgeon: Chelsea Carlin POUR, DO;  Location: [facility];  Service: Endoscopy;;  gastric colon   CHOLECYSTECTOMY OPEN  age 62's   COLONOSCOPY N/A 04/10/2018   recto-sigmoid diverticulosis, torturous left colon, external/internal hemorrhoids   COLONOSCOPY WITH PROPOFOL  N/A 04/21/2021   non-bleeding internal hemorrhoid, sigmoid and descending colon diverticulosis, random colonic biopsies consistent with chronic lymphocytic colitis.   CYSTOSCOPY WITH RETROGRADE PYELOGRAM, URETEROSCOPY AND STENT PLACEMENT Right 04/04/2015   Procedure: CYSTOSCOPY WITH RETROGRADE PYELOGRAM, URETEROSCOPY AND STENT PLACEMENT;  Surgeon: Chelsea Likens, MD;  Location: [facility];  Service: Urology;  Laterality: Right;   ESOPHAGOGASTRODUODENOSCOPY N/A 08/25/2015   Grade 1 varices, patent Schatzki's ring, moderate erosive gastritis.    ESOPHAGOGASTRODUODENOSCOPY N/A 02/17/2018   non-obstructing Schatzki's ring, non-bleeding diverticulum in second portion of duodenum, erosive gastritis   ESOPHAGOGASTRODUODENOSCOPY (EGD) WITH PROPOFOL  N/A 04/21/2021   Grade 1 varices, portal gastropathy, gastritis. Surveillance in 3 years. Negative H.pylori.   HOLMIUM LASER APPLICATION Right 04/04/2015   Procedure: HOLMIUM LASER APPLICATION;  Surgeon: Chelsea Likens, MD;  Location: [facility];  Service: Urology;  Laterality: Right;    SOCIAL HISTORY: Social History   Socioeconomic History   Marital status: Married    Spouse name: Not on file   Number of children: Not on file   Years of education: Not on file   Highest education level: Not on file  Occupational History   Not on file  Tobacco Use   Smoking status: Every Day    Current packs/day: 1.00    Types: Cigarettes    Passive exposure: Current   Smokeless tobacco: Never   Tobacco comments:    one pack daily  Vaping Use   Vaping status: Never Used  Substance and Sexual Activity   Alcohol use: Yes    Alcohol/week: 0.0 standard drinks of alcohol    Comment: Very rarely: typically once every 6+ months; typically 1 drink per occurrence.   Drug use: No   Sexual activity: Not on file  Other Topics Concern   Not on file  Social History Narrative   Not on file   Social Drivers of Health   Tobacco Use: High Risk (09/05/2024)   Patient History    Smoking Tobacco Use: Every Day    Smokeless Tobacco Use: Never    Passive Exposure:  Current  Programmer, Applications: Not on file  Food Insecurity: Not on file  Transportation Needs: Not on file  Physical Activity: Not on file  Stress: Not on file  Social Connections: Not on file  Intimate Partner Violence: Not on file  Depression (PHQ2-9): Not on file  Alcohol Screen: Not on file  Housing: Not on file  Utilities: Not on file  Health Literacy: Not on file    FAMILY HISTORY: Family History  Problem  Relation Age of Onset   Liver disease Mother    Diabetes type II Mother    Hypertension Mother    Diabetes Mellitus II Father    Hypertension Father    Heart disease Father    Colon cancer Neg Hx     ALLERGIES:  is allergic to prednisone, clindamycin/lincomycin, penicillamine, penicillins, doxycycline, and sulfamethoxazole -trimethoprim .  MEDICATIONS:  Current Outpatient Medications  Medication Sig Dispense Refill   acetaminophen  (TYLENOL ) 500 MG tablet Take 1,000 mg by mouth every 6 (six) hours as needed for moderate pain.     ALPRAZolam (XANAX) 0.5 MG tablet Take 0.5 mg by mouth daily. (Patient taking differently: Take 0.5 mg by mouth as needed.)     atorvastatin (LIPITOR) 10 MG tablet Take 10 mg by mouth daily.      augmented betamethasone dipropionate (DIPROLENE-AF) 0.05 % cream Apply 1 application topically See admin instructions. Apply once daily, may apply up to 4 times a day as needed for psoriasis     Cyanocobalamin  (VITAMIN B-12) 1000 MCG SUBL TAKE ONE (1) TABLET BY MOUTH EVERY DAY 90 tablet 3   dexamethasone  (DECADRON ) 0.5 MG/5ML solution  (Patient taking differently: as needed.)     diphenhydrAMINE (BENADRYL) 25 MG tablet Take 25 mg by mouth daily as needed for allergies.     ferrous sulfate  325 (65 FE) MG EC tablet Take 1 tablet (325 mg total) by mouth every other day. 45 tablet 3   furosemide (LASIX) 20 MG tablet Take 20 mg by mouth every other day. If gaining in between take two (Patient taking differently: Take 20 mg by mouth daily. If gaining in between take two 2 q am)     HYDROcodone-acetaminophen  (NORCO/VICODIN) 5-325 MG tablet Take 1 tablet by mouth every 6 (six) hours as needed for moderate pain. For lupus     hydroxychloroquine (PLAQUENIL) 200 MG tablet Take 200 mg by mouth 2 (two) times daily.      Krill Oil 500 MG CAPS Take 500 mg by mouth in the morning and at bedtime.     Lancets (ONETOUCH DELICA PLUS LANCET33G) MISC Apply topically.     levothyroxine  (SYNTHROID, LEVOTHROID) 25 MCG tablet Take 25 mcg by mouth daily before breakfast.     magnesium oxide (MAG-OX) 400 MG tablet Take 400 mg by mouth daily.     metoprolol succinate (TOPROL-XL) 50 MG 24 hr tablet Take 50 mg by mouth every morning. Take with or immediately following a meal.     Milk Thistle 1000 MG CAPS Take 1,000 mg by mouth daily.      MOUNJARO 7.5 MG/0.5ML Pen SMARTSIG:7.5 Milligram(s) SUB-Q Once a Week (Patient taking differently: 12.5 mg.)     ONETOUCH ULTRA TEST test strip      pantoprazole (PROTONIX) 20 MG tablet Take 20 mg by mouth daily.     Polyethyl Glycol-Propyl Glycol (SYSTANE OP) Place 1 drop into both eyes daily as needed (dry eyes).     Probiotic Product (PROBIOTIC PO) Take 1 capsule by mouth daily.  tiZANidine (ZANAFLEX) 4 MG tablet Take 4 mg by mouth at bedtime.     TREMFYA 100 MG/ML SOPN Inject 1 Dose into the skin every 8 (eight) weeks.     UNABLE TO FIND Med Name: Plexus for gut health     No current facility-administered medications for this visit.    REVIEW OF SYSTEMS:   Constitutional: ( - ) fevers, ( - )  chills , ( - ) night sweats Eyes: ( - ) blurriness of vision, ( - ) double vision, ( - ) watery eyes Ears, nose, mouth, throat, and face: ( - ) mucositis, ( - ) sore throat Respiratory: ( - ) cough, ( - ) dyspnea, ( - ) wheezes Cardiovascular: ( - ) palpitation, ( - ) chest discomfort, ( - ) lower extremity swelling Gastrointestinal:  ( - ) nausea, ( - ) heartburn, ( - ) change in bowel habits Skin: ( - ) abnormal skin rashes Lymphatics: ( - ) new lymphadenopathy, ( - ) easy bruising Neurological: ( - ) numbness, ( - ) tingling, ( - ) new weaknesses Behavioral/Psych: ( - ) mood change, ( - ) new changes  All other systems were reviewed with the patient and are negative.  PHYSICAL EXAMINATION: ECOG PERFORMANCE STATUS: {CHL ONC ECOG [phone]}  There were no vitals filed for this visit. There were no vitals filed for this visit.  GENERAL:  well appearing *** in NAD  SKIN: skin color, texture, turgor are normal, no rashes or significant lesions EYES: conjunctiva are pink and non-injected, sclera clear OROPHARYNX: no exudate, no erythema; lips, buccal mucosa, and tongue normal  NECK: supple, non-tender LYMPH:  no palpable lymphadenopathy in the cervical, axillary or supraclavicular lymph nodes.  LUNGS: clear to auscultation and percussion with normal breathing effort HEART: regular rate & rhythm and no murmurs and no lower extremity edema ABDOMEN: soft, non-tender, non-distended, normal bowel sounds Musculoskeletal: no cyanosis of digits and no clubbing  PSYCH: alert & oriented x 3, fluent speech NEURO: no focal motor/sensory deficits  LABORATORY DATA:  I have reviewed the data as listed    Latest Ref Rng & Units 08/24/2024   10:33 AM 02/06/2024   10:39 AM 11/07/2023   10:58 AM  CBC  WBC 4.0 - 10.5 K/uL 4.4  3.8  3.8   Hemoglobin 12.0 - 15.0 g/dL 85.7  86.5  87.0   Hematocrit 36.0 - 46.0 % 42.5  39.1  38.4   Platelets 150 - 400 K/uL 67  83  81        Latest Ref Rng & Units 08/24/2024   10:33 AM 02/06/2024   10:39 AM 11/07/2023   10:58 AM  CMP  Glucose 70 - 99 mg/dL 888  762  747   BUN 6 - 20 mg/dL 17  22  25    Creatinine 0.44 - 1.00 mg/dL 8.87  8.75  8.73   Sodium 135 - 145 mmol/L 141  137  134   Potassium 3.5 - 5.1 mmol/L 4.0  3.8  3.8   Chloride 98 - 111 mmol/L 102  104  99   CO2 22 - 32 mmol/L 26  25  24    Calcium 8.9 - 10.3 mg/dL 9.8  9.0  9.4   Total Protein 6.5 - 8.1 g/dL 7.8  7.4  7.2   Total Bilirubin 0.0 - 1.2 mg/dL 0.8  0.9  0.9   Alkaline Phos 38 - 126 U/L 192  110  84   AST 15 - 41  U/L 55  49  48   ALT 0 - 44 U/L 32  27  31     RADIOGRAPHIC STUDIES: I have personally reviewed the radiological images as listed and agreed with the findings in the report. No results found.  ASSESSMENT & PLAN Chelsea Estrada is a 59 y.o. female who presents to the clinic for continued management of thrombocytopenia  and vitamin B12 deficiency.   #Thrombocytopenia: --Likely secondary to liver cirrhosis and splenomegaly.  Also being contributed by vitamin B12 deficiency.   --Hepatitis panel: Negative --Labs today show platelet count is ***.  --Patient denies any overt signs of bleeding.  --Continue to monitor.  #Vitamin B12 deficiency: --Currently on oral vitamin B12 replacement --Labs today show vitamin B12 is ***  #Hepatic cirrhosis #Splenoegmaly: --Chronic in nature and following with GI --Most recent spleen was measuring 17.5 cm  #Leukopenia: --Secondary to liver cirrhosis and plaquenil use --No infectious symptoms  --continue to monitor  #Macrocytic anemia: --Secondary to B12 deficiency --Resolved at this time. ***  #Iron deficiency: --Current on ferrous sulfate  325 mg every other day --Iron panel today shows ***  No orders of the defined types were placed in this encounter.   All questions were answered. The patient knows to call the clinic with any problems, questions or concerns.  I have spent a total of {CHL ONC TIME VISIT - DTPQU:[id number]} minutes of face-to-face and non-face-to-face time, preparing to see the patient, obtaining and/or reviewing separately obtained history, performing a medically appropriate examination, counseling and educating the patient, ordering medications/tests/procedures, referring and communicating with other health care professionals, documenting clinical information in the electronic health record, independently interpreting results and communicating results to the patient, and care coordination.   Johnston Police, PA-C Department of Hematology/Oncology [facility] at [facility] Phone: [phone] "

## 2024-09-18 ENCOUNTER — Ambulatory Visit (HOSPITAL_COMMUNITY)
Admission: RE | Admit: 2024-09-18 | Discharge: 2024-09-18 | Disposition: A | Discharge status: Home / Self Care | Source: Ambulatory Visit | Attending: Gastroenterology | Admitting: Gastroenterology

## 2024-09-18 DIAGNOSIS — K7469 Other cirrhosis of liver: Secondary | ICD-10-CM | POA: Insufficient documentation

## 2024-09-24 NOTE — Progress Notes (Signed)
 " [facility] at [facility]  HEMATOLOGY FOLLOW-UP VISIT  Shona Norleen PEDLAR, MD  REASON FOR FOLLOW-UP: Thrombocytopenia  ASSESSMENT & PLAN:  Patient is a 59 y.o. female with liver cirrhosis, vitamin B12 deficiency and splenomegaly following for thrombocytopenia   Assessment and Plan  Thrombocytopenia (HCC) Likely secondary to liver cirrhosis and splenomegaly.  Also being contributed by vitamin B12 deficiency and plaquenil use Hepatitis panel: Negative No active signs of bleeding or ecchymosis.   -Slightly lower than before. Occasional bruises but mostly asymptomatic.  -Continue to monitor  Return to clinic in 6 months with labs   Vitamin B12 deficiency Patient had severe vitamin B12 deficiency, improving with supplementation.   Completed parenteral supplementation   -Continue oral vitamin B12   Return to clinic in 6 months with labs   Hepatic cirrhosis Chronic, contributing to thrombocytopenia and splenomegaly.   -No changes to current management. -Follow up with GI   Splenomegaly Likely secondary to liver cirrhosis, contributing to thrombocytopenia.   Recent ultrasound showed a size of 17.5 cm   -No changes to current management.    Iron deficiency Patient has mild iron deficiency with TSAT less than 20 and ferritin less than 50 with no anemia   - Can discontinue oral ferrous sulfate  every other day.      Orders Placed This Encounter  Procedures   Ferritin    Standing Status:   Future    Expected Date:   03/25/2025    Expiration Date:   06/23/2025   Folate    Standing Status:   Future    Expected Date:   03/25/2025    Expiration Date:   06/23/2025   Vitamin B12    Standing Status:   Future    Expected Date:   03/25/2025    Expiration Date:   06/23/2025   CBC with Differential/Platelet    Standing Status:   Future    Expected Date:   03/25/2025    Expiration Date:   06/23/2025   Comprehensive metabolic panel with GFR    Standing  Status:   Future    Expected Date:   03/25/2025    Expiration Date:   06/23/2025   Iron and TIBC    Standing Status:   Future    Expected Date:   03/25/2025    Expiration Date:   06/23/2025    The total time spent in the appointment was 12 minutes encounter with patients including review of chart and various tests results, discussions about plan of care and coordination of care plan   All questions were answered. The patient knows to call the clinic with any problems, questions or concerns. No barriers to learning was detected.   LILLETTE Verneta SAUNDERS Teague,acting as a neurosurgeon for Mickiel Dry, MD.,have documented all relevant documentation on the behalf of Mickiel Dry, MD,as directed by  Mickiel Dry, MD while in the presence of Mickiel Dry, MD.  I, Mickiel Dry MD, have reviewed the above documentation for accuracy and completeness, and I agree with the above.    Mickiel Dry, MD 1/20/20268:27 AM    INTERVAL HISTORY: Chelsea Estrada 59 y.o. female is here for follow-up for thrombocytopenia.    Annamae is doing well overall. She reports bruising on her right arm and on her legs when her large dog jumped on her when sitting, which we discussed being due to her thrombocytopenia. She is taking hydroxychloroquine for her lupus, which may be contributing to her thrombocytopenia.  She denies  any hematochezia or melena.   Jailin is taking iron supplements every other day and vitamin B 12 supplements as prescribed. She is agreeable to discontinue iron supplements as her anemia has resolved.   I have reviewed the past medical history, past surgical history, social history and family history with the patient   ALLERGIES:  is allergic to prednisone, clindamycin/lincomycin, penicillamine, penicillins, doxycycline, and sulfamethoxazole -trimethoprim .  MEDICATIONS:  Current Outpatient Medications  Medication Sig Dispense Refill   acetaminophen  (TYLENOL ) 500 MG tablet Take 1,000 mg  by mouth every 6 (six) hours as needed for moderate pain.     ALPRAZolam (XANAX) 0.5 MG tablet Take 0.5 mg by mouth daily. (Patient taking differently: Take 0.5 mg by mouth as needed.)     atorvastatin (LIPITOR) 10 MG tablet Take 10 mg by mouth daily.      augmented betamethasone dipropionate (DIPROLENE-AF) 0.05 % cream Apply 1 application topically See admin instructions. Apply once daily, may apply up to 4 times a day as needed for psoriasis     Cyanocobalamin  (VITAMIN B-12) 1000 MCG SUBL TAKE ONE (1) TABLET BY MOUTH EVERY DAY 90 tablet 3   diphenhydrAMINE (BENADRYL) 25 MG tablet Take 25 mg by mouth daily as needed for allergies.     ferrous sulfate  325 (65 FE) MG EC tablet Take 1 tablet (325 mg total) by mouth every other day. 45 tablet 3   furosemide (LASIX) 20 MG tablet Take 20 mg by mouth every other day. If gaining in between take two (Patient taking differently: Take 40 mg by mouth daily.)     HYDROcodone-acetaminophen  (NORCO/VICODIN) 5-325 MG tablet Take 1 tablet by mouth every 6 (six) hours as needed for moderate pain. For lupus     hydroxychloroquine (PLAQUENIL) 200 MG tablet Take 200 mg by mouth 2 (two) times daily.      Krill Oil 500 MG CAPS Take 500 mg by mouth in the morning and at bedtime.     Lancets (ONETOUCH DELICA PLUS LANCET33G) MISC Apply topically.     levothyroxine (SYNTHROID, LEVOTHROID) 25 MCG tablet Take 25 mcg by mouth daily before breakfast.     magnesium oxide (MAG-OX) 400 MG tablet Take 400 mg by mouth daily. (Patient taking differently: Take 400 mg by mouth 2 (two) times daily.)     metoprolol succinate (TOPROL-XL) 50 MG 24 hr tablet Take 50 mg by mouth every morning. Take with or immediately following a meal.     Milk Thistle 1000 MG CAPS Take 1,000 mg by mouth daily.      ONETOUCH ULTRA TEST test strip      pantoprazole (PROTONIX) 20 MG tablet Take 20 mg by mouth daily.     Polyethyl Glycol-Propyl Glycol (SYSTANE OP) Place 1 drop into both eyes daily as needed (dry  eyes).     Probiotic Product (PROBIOTIC PO) Take 1 capsule by mouth daily.     tiZANidine (ZANAFLEX) 4 MG tablet Take 4 mg by mouth at bedtime.     TREMFYA 100 MG/ML SOPN Inject 1 Dose into the skin every 8 (eight) weeks.     UNABLE TO FIND Med Name: Plexus for gut health     MOUNJARO 12.5 MG/0.5ML Pen SMARTSIG:12.5 Milligram(s) SUB-Q Once a Week     No current facility-administered medications for this visit.    PHYSICAL EXAMINATION:   Vitals:   09/25/24 0801  BP: (!) 77/58  Pulse: 80  Resp: 16  Temp: 98 F (36.7 C)  SpO2: 100%  GENERAL:alert, no distress and comfortable.  SKIN: 3x4 cms healing bruise on the inner side of right arm LUNGS: clear to auscultation and percussion with normal breathing effort HEART: regular rate & rhythm and no murmurs and no lower extremity edema ABDOMEN:abdomen soft, non-tender and normal bowel sounds Musculoskeletal:no cyanosis of digits and no clubbing  NEURO: alert & oriented x 3 with fluent speech  LABORATORY DATA:  I have reviewed the data as listed  Lab Results  Component Value Date   WBC 4.4 08/24/2024   NEUTROABS 2.8 08/24/2024   HGB 14.2 08/24/2024   HCT 42.5 08/24/2024   MCV 97.0 08/24/2024   PLT 67 (L) 08/24/2024       Chemistry      Component Value Date/Time   NA 141 08/24/2024 1033   NA 143 06/22/2023 1001   K 4.0 08/24/2024 1033   CL 102 08/24/2024 1033   CO2 26 08/24/2024 1033   BUN 17 08/24/2024 1033   BUN 31 (H) 06/22/2023 1001   CREATININE 1.12 (H) 08/24/2024 1033      Component Value Date/Time   CALCIUM 9.8 08/24/2024 1033   ALKPHOS 192 (H) 08/24/2024 1033   AST 55 (H) 08/24/2024 1033   ALT 32 08/24/2024 1033   BILITOT 0.8 08/24/2024 1033   BILITOT 0.5 06/22/2023 1001      Latest Reference Range & Units 08/24/24 10:33  Iron 28 - 170 ug/dL 90  UIBC ug/dL 771  TIBC 749 - 549 ug/dL 681  Saturation Ratios 10.4 - 31.8 % 28  Ferritin 11 - 307 ng/mL 84  Folate >5.9 ng/mL 17.3  Vitamin B12 180 -  914 pg/mL 906   (H): Data is abnormally high  Latest Reference Range & Units 04/28/15 11:07 07/06/23 08:42  Hep A Ab, IgM NON REACTIVE   NON REACTIVE  Hepatitis B Surface Ag NON REACTIVE  Negative NON REACTIVE  Hep B Core Ab, IgM NON REACTIVE   NON REACTIVE  Hep C Virus Ab 0.0 - 0.9 s/co ratio 0.2   HCV Ab NON REACTIVE   NON REACTIVE  HIV Screen 4th Generation wRfx Non Reactive   Non Reactive    Latest Reference Range & Units 11/07/23 10:58 11/07/23 10:59  Folate >5.9 ng/mL 31.1   Intrinsic Factor 0.0 - 1.1 AU/mL 1.1   Methylmalonic Acid, Quantitative 0 - 378 nmol/L 204     EXAM: ABDOMEN ULTRASOUND COMPLETE   COMPARISON:  Ultrasound abdomen 09/24/2022   FINDINGS: Gallbladder: Surgically absent   Common bile duct: Diameter: 4.7 mm   Liver: Coarsened echogenicity and nodular contour. No focal lesion. Portal vein is patent on color Doppler imaging with normal direction of blood flow towards the liver.   IVC: No abnormality visualized.   Pancreas: Visualized portion unremarkable.   Spleen: Enlarged measuring 17.6 cm   Right Kidney: Length: 11.2 cm. Echogenicity within normal limits. No mass or hydronephrosis visualized.   Left Kidney: Length: 12.5 cm. Echogenicity within normal limits. No mass or hydronephrosis visualized.   Abdominal aorta: No aneurysm visualized.   Other findings: None.   IMPRESSION: 1. Cirrhotic morphology of the liver. No focal lesion. 2. Splenomegaly. "

## 2024-09-25 ENCOUNTER — Inpatient Hospital Stay: Admitting: Oncology

## 2024-09-25 VITALS — BP 77/58 | HR 80 | Temp 98.0°F | Resp 16 | Wt 184.3 lb

## 2024-09-25 DIAGNOSIS — E611 Iron deficiency: Secondary | ICD-10-CM | POA: Diagnosis not present

## 2024-09-25 DIAGNOSIS — R161 Splenomegaly, not elsewhere classified: Secondary | ICD-10-CM

## 2024-09-25 DIAGNOSIS — D696 Thrombocytopenia, unspecified: Secondary | ICD-10-CM

## 2024-09-25 DIAGNOSIS — E538 Deficiency of other specified B group vitamins: Secondary | ICD-10-CM

## 2024-09-25 DIAGNOSIS — K7460 Unspecified cirrhosis of liver: Secondary | ICD-10-CM | POA: Diagnosis not present

## 2024-10-01 ENCOUNTER — Ambulatory Visit: Payer: Self-pay | Admitting: Gastroenterology

## 2024-10-18 ENCOUNTER — Encounter: Admitting: Nutrition

## 2025-03-25 ENCOUNTER — Inpatient Hospital Stay

## 2025-04-01 ENCOUNTER — Inpatient Hospital Stay: Admitting: Physician Assistant
